# Patient Record
Sex: Male | Born: 1945
Health system: Southern US, Community
[De-identification: ages and names within clinical notes are randomized; demographics above are authoritative.]

## PROBLEM LIST (undated history)

## (undated) DIAGNOSIS — F32A Depression, unspecified: Secondary | ICD-10-CM

## (undated) DIAGNOSIS — R7303 Prediabetes: Secondary | ICD-10-CM

## (undated) DIAGNOSIS — F329 Major depressive disorder, single episode, unspecified: Secondary | ICD-10-CM

## (undated) DIAGNOSIS — M1990 Unspecified osteoarthritis, unspecified site: Secondary | ICD-10-CM

## (undated) DIAGNOSIS — K219 Gastro-esophageal reflux disease without esophagitis: Secondary | ICD-10-CM

## (undated) DIAGNOSIS — R51 Headache: Secondary | ICD-10-CM

## (undated) DIAGNOSIS — G4730 Sleep apnea, unspecified: Secondary | ICD-10-CM

## (undated) DIAGNOSIS — Z8719 Personal history of other diseases of the digestive system: Secondary | ICD-10-CM

## (undated) HISTORY — PX: BACK SURGERY: SHX140

## (undated) HISTORY — PX: KNEE ARTHROSCOPY: SUR90

## (undated) HISTORY — PX: VASECTOMY: SHX75

## (undated) HISTORY — PX: MASS EXCISION: SHX2000

---

## 1978-03-16 HISTORY — PX: VASECTOMY: SHX75

## 1986-03-16 HISTORY — PX: MASS EXCISION: SHX2000

## 1993-03-16 HISTORY — PX: BACK SURGERY: SHX140

## 1999-02-19 ENCOUNTER — Encounter: Admission: RE | Admit: 1999-02-19 | Discharge: 1999-02-19 | Payer: Self-pay | Admitting: *Deleted

## 2000-05-18 ENCOUNTER — Encounter (INDEPENDENT_AMBULATORY_CARE_PROVIDER_SITE_OTHER): Payer: Self-pay | Admitting: Specialist

## 2000-05-18 ENCOUNTER — Ambulatory Visit (HOSPITAL_COMMUNITY): Admission: RE | Admit: 2000-05-18 | Discharge: 2000-05-18 | Payer: Self-pay | Admitting: *Deleted

## 2002-08-21 ENCOUNTER — Ambulatory Visit (HOSPITAL_BASED_OUTPATIENT_CLINIC_OR_DEPARTMENT_OTHER): Admission: RE | Admit: 2002-08-21 | Discharge: 2002-08-21 | Payer: Self-pay | Admitting: Internal Medicine

## 2003-08-25 ENCOUNTER — Encounter: Admission: RE | Admit: 2003-08-25 | Discharge: 2003-08-25 | Payer: Self-pay | Admitting: Orthopedic Surgery

## 2008-01-12 ENCOUNTER — Encounter: Admission: RE | Admit: 2008-01-12 | Discharge: 2008-01-12 | Payer: Self-pay | Admitting: Orthopedic Surgery

## 2008-04-23 ENCOUNTER — Encounter: Admission: RE | Admit: 2008-04-23 | Discharge: 2008-04-23 | Payer: Self-pay | Admitting: Family Medicine

## 2011-08-31 ENCOUNTER — Other Ambulatory Visit: Payer: Self-pay | Admitting: Family Medicine

## 2011-08-31 DIAGNOSIS — F17200 Nicotine dependence, unspecified, uncomplicated: Secondary | ICD-10-CM

## 2011-08-31 DIAGNOSIS — Z8249 Family history of ischemic heart disease and other diseases of the circulatory system: Secondary | ICD-10-CM

## 2011-09-21 ENCOUNTER — Ambulatory Visit
Admission: RE | Admit: 2011-09-21 | Discharge: 2011-09-21 | Disposition: A | Discharge status: Home / Self Care | Payer: Medicare Other | Source: Ambulatory Visit | Attending: Family Medicine | Admitting: Family Medicine

## 2011-09-21 DIAGNOSIS — Z8249 Family history of ischemic heart disease and other diseases of the circulatory system: Secondary | ICD-10-CM

## 2011-09-21 DIAGNOSIS — F17200 Nicotine dependence, unspecified, uncomplicated: Secondary | ICD-10-CM | POA: Diagnosis not present

## 2011-10-08 DIAGNOSIS — M1710 Unilateral primary osteoarthritis, unspecified knee: Secondary | ICD-10-CM | POA: Diagnosis not present

## 2011-10-08 DIAGNOSIS — IMO0002 Reserved for concepts with insufficient information to code with codable children: Secondary | ICD-10-CM | POA: Diagnosis not present

## 2011-10-15 DIAGNOSIS — M1710 Unilateral primary osteoarthritis, unspecified knee: Secondary | ICD-10-CM | POA: Diagnosis not present

## 2011-10-15 DIAGNOSIS — IMO0002 Reserved for concepts with insufficient information to code with codable children: Secondary | ICD-10-CM | POA: Diagnosis not present

## 2011-10-22 DIAGNOSIS — M1710 Unilateral primary osteoarthritis, unspecified knee: Secondary | ICD-10-CM | POA: Diagnosis not present

## 2011-10-22 DIAGNOSIS — IMO0002 Reserved for concepts with insufficient information to code with codable children: Secondary | ICD-10-CM | POA: Diagnosis not present

## 2011-11-12 DIAGNOSIS — K648 Other hemorrhoids: Secondary | ICD-10-CM | POA: Diagnosis not present

## 2011-11-12 DIAGNOSIS — Z1211 Encounter for screening for malignant neoplasm of colon: Secondary | ICD-10-CM | POA: Diagnosis not present

## 2011-11-25 DIAGNOSIS — Z23 Encounter for immunization: Secondary | ICD-10-CM | POA: Diagnosis not present

## 2011-12-03 DIAGNOSIS — IMO0002 Reserved for concepts with insufficient information to code with codable children: Secondary | ICD-10-CM | POA: Diagnosis not present

## 2011-12-03 DIAGNOSIS — M1710 Unilateral primary osteoarthritis, unspecified knee: Secondary | ICD-10-CM | POA: Diagnosis not present

## 2011-12-16 DIAGNOSIS — D485 Neoplasm of uncertain behavior of skin: Secondary | ICD-10-CM | POA: Diagnosis not present

## 2011-12-16 DIAGNOSIS — L821 Other seborrheic keratosis: Secondary | ICD-10-CM | POA: Diagnosis not present

## 2011-12-16 DIAGNOSIS — L820 Inflamed seborrheic keratosis: Secondary | ICD-10-CM | POA: Diagnosis not present

## 2012-01-28 DIAGNOSIS — G43109 Migraine with aura, not intractable, without status migrainosus: Secondary | ICD-10-CM | POA: Diagnosis not present

## 2012-02-05 DIAGNOSIS — M1710 Unilateral primary osteoarthritis, unspecified knee: Secondary | ICD-10-CM | POA: Diagnosis not present

## 2012-02-05 DIAGNOSIS — IMO0002 Reserved for concepts with insufficient information to code with codable children: Secondary | ICD-10-CM | POA: Diagnosis not present

## 2012-03-18 DIAGNOSIS — M25569 Pain in unspecified knee: Secondary | ICD-10-CM | POA: Diagnosis not present

## 2012-03-18 DIAGNOSIS — I10 Essential (primary) hypertension: Secondary | ICD-10-CM | POA: Diagnosis not present

## 2012-03-18 DIAGNOSIS — G4730 Sleep apnea, unspecified: Secondary | ICD-10-CM | POA: Diagnosis not present

## 2012-03-18 DIAGNOSIS — Z1331 Encounter for screening for depression: Secondary | ICD-10-CM | POA: Diagnosis not present

## 2012-04-19 ENCOUNTER — Other Ambulatory Visit: Payer: Self-pay | Admitting: Orthopedic Surgery

## 2012-04-19 MED ORDER — DEXAMETHASONE SODIUM PHOSPHATE 10 MG/ML IJ SOLN: 10.0000 mg | Freq: Once | INTRAMUSCULAR | Status: DC

## 2012-04-19 MED ORDER — BUPIVACAINE LIPOSOME 1.3 % IJ SUSP: 20.0000 mL | Freq: Once | INTRAMUSCULAR | Status: DC

## 2012-04-19 NOTE — Progress Notes (Signed)
Preoperative surgical orders have been place into the Epic hospital system for Craig Rice on 04/19/2012, 7:48 AM  by Patrica Duel for surgery on 05/23/2012.  Preop Total Knee orders including Experal, IV Tylenol, and IV Decadron as long as there are no contraindications to the above medications. [name], PA-C

## 2012-05-10 ENCOUNTER — Encounter (HOSPITAL_COMMUNITY): Payer: Self-pay | Admitting: Pharmacy Technician

## 2012-05-16 ENCOUNTER — Encounter (HOSPITAL_COMMUNITY)
Admission: RE | Admit: 2012-05-16 | Discharge: 2012-05-16 | Disposition: A | Discharge status: Home / Self Care | Payer: Medicare Other | Source: Ambulatory Visit | Attending: Orthopedic Surgery | Admitting: Orthopedic Surgery

## 2012-05-16 ENCOUNTER — Encounter (HOSPITAL_COMMUNITY): Payer: Self-pay

## 2012-05-16 DIAGNOSIS — F3289 Other specified depressive episodes: Secondary | ICD-10-CM | POA: Diagnosis not present

## 2012-05-16 DIAGNOSIS — D62 Acute posthemorrhagic anemia: Secondary | ICD-10-CM | POA: Diagnosis not present

## 2012-05-16 DIAGNOSIS — E871 Hypo-osmolality and hyponatremia: Secondary | ICD-10-CM | POA: Diagnosis not present

## 2012-05-16 DIAGNOSIS — Z79899 Other long term (current) drug therapy: Secondary | ICD-10-CM | POA: Diagnosis not present

## 2012-05-16 DIAGNOSIS — M1710 Unilateral primary osteoarthritis, unspecified knee: Secondary | ICD-10-CM | POA: Diagnosis not present

## 2012-05-16 DIAGNOSIS — K219 Gastro-esophageal reflux disease without esophagitis: Secondary | ICD-10-CM | POA: Diagnosis present

## 2012-05-16 DIAGNOSIS — F329 Major depressive disorder, single episode, unspecified: Secondary | ICD-10-CM | POA: Diagnosis present

## 2012-05-16 DIAGNOSIS — IMO0002 Reserved for concepts with insufficient information to code with codable children: Secondary | ICD-10-CM | POA: Diagnosis not present

## 2012-05-16 HISTORY — DX: Depression, unspecified: F32.A

## 2012-05-16 HISTORY — DX: Personal history of other diseases of the digestive system: Z87.19

## 2012-05-16 HISTORY — DX: Gastro-esophageal reflux disease without esophagitis: K21.9

## 2012-05-16 HISTORY — DX: Headache: R51

## 2012-05-16 HISTORY — DX: Unspecified osteoarthritis, unspecified site: M19.90

## 2012-05-16 HISTORY — DX: Major depressive disorder, single episode, unspecified: F32.9

## 2012-05-16 LAB — COMPREHENSIVE METABOLIC PANEL
ALT: 28 U/L (ref 0–53)
AST: 28 U/L (ref 0–37)
Albumin: 4.1 g/dL (ref 3.5–5.2)
CO2: 26 mEq/L (ref 19–32)
Calcium: 9.5 mg/dL (ref 8.4–10.5)
Chloride: 102 mEq/L (ref 96–112)
GFR calc non Af Amer: 72 mL/min — ABNORMAL LOW (ref >90)
Sodium: 136 mEq/L (ref 135–145)
Total Bilirubin: 0.6 mg/dL (ref 0.3–1.2)

## 2012-05-16 LAB — ABO/RH: ABO/RH(D): O POS

## 2012-05-16 LAB — URINALYSIS, ROUTINE W REFLEX MICROSCOPIC
Glucose, UA: NEGATIVE mg/dL
Ketones, ur: NEGATIVE mg/dL
Leukocytes, UA: NEGATIVE
Nitrite: NEGATIVE
Protein, ur: NEGATIVE mg/dL

## 2012-05-16 LAB — CBC
Platelets: 186 10*3/uL (ref 150–400)
RBC: 4.6 MIL/uL (ref 4.22–5.81)
RDW: 12.6 % (ref 11.5–15.5)
WBC: 6.5 10*3/uL (ref 4.0–10.5)

## 2012-05-16 LAB — SURGICAL PCR SCREEN: Staphylococcus aureus: POSITIVE — AB

## 2012-05-16 NOTE — Progress Notes (Signed)
05/16/12 1012  OBSTRUCTIVE SLEEP APNEA  Have you ever been diagnosed with sleep apnea through a sleep study? No  If yes, do you have and use a CPAP or BPAP machine every night? 0  Do you snore loudly (loud enough to be heard through closed doors)?  1  Do you often feel tired, fatigued, or sleepy during the daytime? 0  Has anyone observed you stop breathing during your sleep? 1  Do you have, or are you being treated for high blood pressure? 0  BMI more than 35 kg/m2? 1  Age over 67 years old? 1  Neck circumference greater than 40 cm/18 inches? 0  Obstructive Sleep Apnea Score 4  Score 4 or greater  Results sent to PCP

## 2012-05-16 NOTE — Pre-Procedure Instructions (Addendum)
05-16-12 EKG 1'14 -report with chart. 05-16-12 1615 Pt. Notified with voice message of Positive PCR screen- Staph aureus. W. Hendrick,RN 05-17-12 1030 Pt. Confirmed receipt of call for Positive Staph aureus-will use Mupirocin as directed. W. Kennon Portela

## 2012-05-16 NOTE — Patient Instructions (Addendum)
20 Craig Rice  05/16/2012   Your procedure is scheduled on:   05-23-2012  Report to [facility] at    0730    AM .  Call this number if you have problems the morning of surgery: [phone]  Or Presurgical Testing [phone]([name])    Do not eat food:After Midnight.    Take these medicines the morning of surgery with A SIP OF WATER: Allopurinol. Metoprolol. Omeprazole. Paxil.   Do not wear jewelry, make-up or nail polish.  Do not wear lotions, powders, or perfumes. You may wear deodorant.  Do not shave 12 hours prior to first CHG shower(legs and under arms).(face and neck okay.)  Do not bring valuables to the hospital.  Contacts, dentures or bridgework,body piercing,  may not be worn into surgery.  Leave suitcase in the car. After surgery it may be brought to your room.  For patients admitted to the hospital, checkout time is 11:00 AM the day of discharge.   Patients discharged the day of surgery will not be allowed to drive home. Must have responsible person with you x 24 hours once discharged.  Name and phone number of your driver:  "[name]"-spouse [phone] cell Special Instructions: CHG(Chlorhedine 4%-"Hibiclens","Betasept","Aplicare") Shower Use Special Wash: see special instructions.(avoid face and genitals)   Please read over the following fact sheets that you were given: MRSA Information, Blood Transfusion fact sheet, Incentive Spirometry Instruction.    Failure to follow these instructions may result in Cancellation of your surgery.   Patient signature_______________________________________________________

## 2012-05-22 NOTE — H&P (Signed)
TOTAL KNEE ADMISSION H&P  Patient is being admitted for right total knee arthroplasty.  Subjective:  Chief Complaint:right knee pain.  HPI: Craig Rice, 68 y.o. male, has a history of pain and functional disability in the right knee due to arthritis and has failed non-surgical conservative treatments for greater than 12 weeks to includeNSAID's and/or analgesics, corticosteriod injections, viscosupplementation injections and activity modification.  Onset of symptoms was gradual, starting 5 years ago with gradually worsening course since that time. The patient noted prior procedures on the knee to include  arthroscopy and menisectomy on the right knee(s).  Patient currently rates pain in the right knee(s) at 7 out of 10 with activity. Patient has night pain, worsening of pain with activity and weight bearing, pain that interferes with activities of daily living, pain with passive range of motion and crepitus.  Patient has evidence of periarticular osteophytes and joint space narrowing by imaging studies. There is no active infection.  Past Medical History  Diagnosis Date  . Headache     tx. migraines-"Metoprolol"  . Depression   . GERD (gastroesophageal reflux disease)   . H/O hiatal hernia   . Arthritis     osteoarthritis -knee    Past Surgical History  Procedure Laterality Date  . Back surgery      lumbar laminectomy  . Knee arthroscopy      bil. knee scopes  . Vasectomy    . Mass excision      chest"benign"     Current outpatient prescriptions: allopurinol (ZYLOPRIM) 100 MG tablet, Take 200 mg by mouth daily., Disp: , Rfl: ;   metoprolol (LOPRESSOR) 50 MG tablet, Take 50 mg by mouth daily before breakfast., Disp: , Rfl: ;  naproxen sodium (ANAPROX) 220 MG tablet, Take 220 mg by mouth 2 (two) times daily with a meal., Disp: , Rfl: ;   omeprazole (PRILOSEC OTC) 20 MG tablet, Take 20-40 mg by mouth daily., Disp: , Rfl:  PARoxetine (PAXIL) 20 MG tablet, Take 20 mg by mouth daily  before breakfast., Disp: , Rfl:    No Known Allergies  History  Substance Use Topics  . Smoking status: Former Smoker    Types: Cigarettes    Quit date: 05/16/1980  . Smokeless tobacco: Not on file  . Alcohol Use: 3.0 oz/week    5 Shots of liquor per week     Comment: x 5 drinks per week    Family History Father age 57 living; history of aneurysm and total knee arthroplasty Mother age 15 living; history of bilateral total knee and total hip arthroplasties Brother age 19 living; history total knee arthroplasty Sister age 69 living: history of total hip arthroplasty   Review of Systems  Constitutional: Positive for malaise/fatigue. Negative for fever, chills, weight loss and diaphoresis.  HENT: Positive for tinnitus. Negative for hearing loss, ear pain, nosebleeds, congestion, sore throat, neck pain and ear discharge.   Eyes: Negative.   Respiratory: Negative.  Negative for stridor.   Cardiovascular: Positive for orthopnea. Negative for chest pain, palpitations, claudication, leg swelling and PND.  Gastrointestinal: Positive for heartburn. Negative for nausea, vomiting, abdominal pain, diarrhea, constipation, blood in stool and melena.  Genitourinary: Negative.   Musculoskeletal: Positive for back pain and joint pain. Negative for myalgias and falls.       Right knee pain  Skin: Negative.   Neurological: Positive for tremors. Negative for dizziness, tingling, sensory change, speech change, focal weakness, seizures, loss of consciousness, weakness and headaches.  Endo/Heme/Allergies: Negative.  Psychiatric/Behavioral: Negative.     Objective:  Physical Exam  Constitutional: He is oriented to person, place, and time. He appears well-developed and well-nourished. No distress.  HENT:  Head: Normocephalic and atraumatic.  Right Ear: External ear normal.  Left Ear: External ear normal.  Nose: Nose normal.  Mouth/Throat: Oropharynx is clear and moist.  Eyes: Conjunctivae and  EOM are normal.  Neck: Normal range of motion. Neck supple. No tracheal deviation present. No thyromegaly present.  Cardiovascular: Normal rate, regular rhythm, normal heart sounds and intact distal pulses.   No murmur heard. Respiratory: Effort normal and breath sounds normal. No respiratory distress. He has no wheezes. He exhibits no tenderness.  GI: Soft. Bowel sounds are normal. He exhibits no distension and no mass. There is no tenderness.  Musculoskeletal:       Right hip: Normal.       Left hip: Normal.       Right knee: He exhibits decreased range of motion and deformity. He exhibits no effusion and no erythema. Tenderness found. Medial joint line and lateral joint line tenderness noted.       Left knee: Normal.       Right lower leg: He exhibits no tenderness and no swelling.       Left lower leg: He exhibits no tenderness and no swelling.  Right knee ROM 5 to 120 degrees. Moderate patellofemoral crepitus.   Lymphadenopathy:    He has no cervical adenopathy.  Neurological: He is alert and oriented to person, place, and time. He has normal strength and normal reflexes. No sensory deficit.  Skin: No rash noted. He is not diaphoretic. No erythema.  Psychiatric: He has a normal mood and affect. His behavior is normal.   Vitals Weight: 270 lb Height: 72 in Body Surface Area: 2.49 m Body Mass Index: 36.62 kg/m Pulse: 60 (Regular) BP: 122/74 (Sitting, Left Arm, Standard)    Imaging Review Plain radiographs demonstrate severe degenerative joint disease of the right knee(s). The overall alignment ismild varus. The bone quality appears to be good for age and reported activity level.  Assessment/Plan:  End stage arthritis, right knee   The patient history, physical examination, clinical judgment of the provider and imaging studies are consistent with end stage degenerative joint disease of the right knee(s) and total knee arthroplasty is deemed medically necessary. The  treatment options including medical management, injection therapy arthroscopy and arthroplasty were discussed at length. The risks and benefits of total knee arthroplasty were presented and reviewed. The risks due to aseptic loosening, infection, stiffness, patella tracking problems, thromboembolic complications and other imponderables were discussed. The patient acknowledged the explanation, agreed to proceed with the plan and consent was signed. Patient is being admitted for inpatient treatment for surgery, pain control, PT, OT, prophylactic antibiotics, VTE prophylaxis, progressive ambulation and ADL's and discharge planning. The patient is planning to be discharged home with home health services     [place], [place]

## 2012-05-23 ENCOUNTER — Inpatient Hospital Stay (HOSPITAL_COMMUNITY): Payer: Medicare Other | Admitting: Certified Registered"

## 2012-05-23 ENCOUNTER — Encounter (HOSPITAL_COMMUNITY): Payer: Self-pay | Admitting: Certified Registered"

## 2012-05-23 ENCOUNTER — Inpatient Hospital Stay (HOSPITAL_COMMUNITY)
Admission: RE | Admit: 2012-05-23 | Discharge: 2012-05-25 | DRG: 470 | Disposition: A | Discharge status: Home Health | Payer: Medicare Other | Source: Ambulatory Visit | Attending: Orthopedic Surgery | Admitting: Orthopedic Surgery

## 2012-05-23 ENCOUNTER — Encounter (HOSPITAL_COMMUNITY)
Admission: RE | Disposition: A | Discharge status: Home Health | Payer: Self-pay | Source: Ambulatory Visit | Attending: Orthopedic Surgery

## 2012-05-23 ENCOUNTER — Encounter (HOSPITAL_COMMUNITY): Payer: Self-pay | Admitting: *Deleted

## 2012-05-23 DIAGNOSIS — Z79899 Other long term (current) drug therapy: Secondary | ICD-10-CM

## 2012-05-23 DIAGNOSIS — F329 Major depressive disorder, single episode, unspecified: Secondary | ICD-10-CM | POA: Diagnosis present

## 2012-05-23 DIAGNOSIS — M179 Osteoarthritis of knee, unspecified: Secondary | ICD-10-CM | POA: Diagnosis present

## 2012-05-23 DIAGNOSIS — D62 Acute posthemorrhagic anemia: Secondary | ICD-10-CM

## 2012-05-23 DIAGNOSIS — M1710 Unilateral primary osteoarthritis, unspecified knee: Principal | ICD-10-CM | POA: Diagnosis present

## 2012-05-23 DIAGNOSIS — E871 Hypo-osmolality and hyponatremia: Secondary | ICD-10-CM

## 2012-05-23 DIAGNOSIS — F3289 Other specified depressive episodes: Secondary | ICD-10-CM | POA: Diagnosis present

## 2012-05-23 DIAGNOSIS — K219 Gastro-esophageal reflux disease without esophagitis: Secondary | ICD-10-CM | POA: Diagnosis present

## 2012-05-23 DIAGNOSIS — Z96651 Presence of right artificial knee joint: Secondary | ICD-10-CM

## 2012-05-23 HISTORY — PX: TOTAL KNEE ARTHROPLASTY: SHX125

## 2012-05-23 LAB — TYPE AND SCREEN: ABO/RH(D): O POS

## 2012-05-23 SURGERY — ARTHROPLASTY, KNEE, TOTAL
Anesthesia: Spinal | Site: Knee | Laterality: Right | Wound class: Clean

## 2012-05-23 MED ORDER — ONDANSETRON HCL 4 MG PO TABS: 4.0000 mg | ORAL_TABLET | Freq: Four times a day (QID) | ORAL | Status: DC | PRN

## 2012-05-23 MED ORDER — PAROXETINE HCL 20 MG PO TABS
20.0000 mg | ORAL_TABLET | Freq: Every day | ORAL | Status: DC
  Administered 2012-05-24 – 2012-05-25 (×2): 20 mg via ORAL
  Filled 2012-05-23 (×3): qty 1

## 2012-05-23 MED ORDER — PHENOL 1.4 % MT LIQD
1.0000 | OROMUCOSAL | Status: DC | PRN
  Filled 2012-05-23: qty 177

## 2012-05-23 MED ORDER — MIDAZOLAM HCL 5 MG/5ML IJ SOLN
INTRAMUSCULAR | Status: DC | PRN
  Administered 2012-05-23: 2 mg via INTRAVENOUS

## 2012-05-23 MED ORDER — METOCLOPRAMIDE HCL 5 MG/ML IJ SOLN: 5.0000 mg | Freq: Three times a day (TID) | INTRAMUSCULAR | Status: DC | PRN

## 2012-05-23 MED ORDER — OXYCODONE HCL 5 MG PO TABS
5.0000 mg | ORAL_TABLET | ORAL | Status: DC | PRN
  Administered 2012-05-23 – 2012-05-25 (×11): 10 mg via ORAL
  Filled 2012-05-23 (×11): qty 2

## 2012-05-23 MED ORDER — POLYETHYLENE GLYCOL 3350 17 G PO PACK: 17.0000 g | PACK | Freq: Every day | ORAL | Status: DC | PRN

## 2012-05-23 MED ORDER — LACTATED RINGERS IV SOLN
INTRAVENOUS | Status: DC
  Administered 2012-05-23: 1000 mL via INTRAVENOUS

## 2012-05-23 MED ORDER — SODIUM CHLORIDE 0.9 % IV SOLN: INTRAVENOUS | Status: DC

## 2012-05-23 MED ORDER — ACETAMINOPHEN 650 MG RE SUPP: 650.0000 mg | Freq: Four times a day (QID) | RECTAL | Status: DC | PRN

## 2012-05-23 MED ORDER — LIDOCAINE HCL (CARDIAC) 20 MG/ML IV SOLN
INTRAVENOUS | Status: DC | PRN
  Administered 2012-05-23: 20 mg via INTRAVENOUS

## 2012-05-23 MED ORDER — BUPIVACAINE LIPOSOME 1.3 % IJ SUSP
20.0000 mL | Freq: Once | INTRAMUSCULAR | Status: DC
  Filled 2012-05-23: qty 20

## 2012-05-23 MED ORDER — PROPOFOL INFUSION 10 MG/ML OPTIME
INTRAVENOUS | Status: DC | PRN
  Administered 2012-05-23: 100 ug/kg/min via INTRAVENOUS

## 2012-05-23 MED ORDER — FENTANYL CITRATE 0.05 MG/ML IJ SOLN
INTRAMUSCULAR | Status: DC | PRN
  Administered 2012-05-23 (×2): 50 ug via INTRAVENOUS

## 2012-05-23 MED ORDER — TRAMADOL HCL 50 MG PO TABS: 50.0000 mg | ORAL_TABLET | Freq: Four times a day (QID) | ORAL | Status: DC | PRN

## 2012-05-23 MED ORDER — BISACODYL 10 MG RE SUPP: 10.0000 mg | Freq: Every day | RECTAL | Status: DC | PRN

## 2012-05-23 MED ORDER — ACETAMINOPHEN 10 MG/ML IV SOLN
1000.0000 mg | Freq: Once | INTRAVENOUS | Status: AC
  Administered 2012-05-23: 1000 mg via INTRAVENOUS

## 2012-05-23 MED ORDER — DEXAMETHASONE SODIUM PHOSPHATE 10 MG/ML IJ SOLN: 10.0000 mg | Freq: Once | INTRAMUSCULAR | Status: AC

## 2012-05-23 MED ORDER — OMEPRAZOLE MAGNESIUM 20 MG PO TBEC: 20.0000 mg | DELAYED_RELEASE_TABLET | Freq: Every day | ORAL | Status: DC

## 2012-05-23 MED ORDER — METHOCARBAMOL 100 MG/ML IJ SOLN: 500.0000 mg | Freq: Four times a day (QID) | INTRAVENOUS | Status: DC | PRN

## 2012-05-23 MED ORDER — SODIUM CHLORIDE 0.9 % IR SOLN
Status: DC | PRN
  Administered 2012-05-23: 1000 mL

## 2012-05-23 MED ORDER — MENTHOL 3 MG MT LOZG
1.0000 | LOZENGE | OROMUCOSAL | Status: DC | PRN
  Filled 2012-05-23: qty 9

## 2012-05-23 MED ORDER — METHOCARBAMOL 500 MG PO TABS
500.0000 mg | ORAL_TABLET | Freq: Four times a day (QID) | ORAL | Status: DC | PRN
  Administered 2012-05-23 – 2012-05-25 (×4): 500 mg via ORAL
  Filled 2012-05-23 (×4): qty 1

## 2012-05-23 MED ORDER — RIVAROXABAN 10 MG PO TABS
10.0000 mg | ORAL_TABLET | Freq: Every day | ORAL | Status: DC
  Administered 2012-05-24 – 2012-05-25 (×2): 10 mg via ORAL
  Filled 2012-05-23 (×3): qty 1

## 2012-05-23 MED ORDER — SODIUM CHLORIDE 0.9 % IV SOLN
INTRAVENOUS | Status: DC
  Administered 2012-05-23 (×2): via INTRAVENOUS

## 2012-05-23 MED ORDER — EPHEDRINE SULFATE 50 MG/ML IJ SOLN
INTRAMUSCULAR | Status: DC | PRN
  Administered 2012-05-23 (×4): 5 mg via INTRAVENOUS

## 2012-05-23 MED ORDER — CEFAZOLIN SODIUM-DEXTROSE 2-3 GM-% IV SOLR
2.0000 g | Freq: Four times a day (QID) | INTRAVENOUS | Status: AC
  Administered 2012-05-23 (×2): 2 g via INTRAVENOUS
  Filled 2012-05-23 (×2): qty 50

## 2012-05-23 MED ORDER — ONDANSETRON HCL 4 MG/2ML IJ SOLN: 4.0000 mg | Freq: Four times a day (QID) | INTRAMUSCULAR | Status: DC | PRN

## 2012-05-23 MED ORDER — DEXTROSE 5 % IV SOLN
3.0000 g | INTRAVENOUS | Status: AC
  Administered 2012-05-23: 3 g via INTRAVENOUS
  Filled 2012-05-23: qty 3000

## 2012-05-23 MED ORDER — ONDANSETRON HCL 4 MG/2ML IJ SOLN
INTRAMUSCULAR | Status: DC | PRN
  Administered 2012-05-23: 4 mg via INTRAVENOUS

## 2012-05-23 MED ORDER — SODIUM CHLORIDE 0.9 % IJ SOLN
INTRAMUSCULAR | Status: DC | PRN
  Administered 2012-05-23: 12:00:00

## 2012-05-23 MED ORDER — METOCLOPRAMIDE HCL 10 MG PO TABS
5.0000 mg | ORAL_TABLET | Freq: Three times a day (TID) | ORAL | Status: DC | PRN
  Administered 2012-05-24: 10 mg via ORAL
  Filled 2012-05-23: qty 1

## 2012-05-23 MED ORDER — MORPHINE SULFATE 2 MG/ML IJ SOLN
1.0000 mg | INTRAMUSCULAR | Status: DC | PRN
  Administered 2012-05-23: 1 mg via INTRAVENOUS
  Filled 2012-05-23: qty 1

## 2012-05-23 MED ORDER — CHLORHEXIDINE GLUCONATE 4 % EX LIQD
60.0000 mL | Freq: Once | CUTANEOUS | Status: DC
  Filled 2012-05-23: qty 60

## 2012-05-23 MED ORDER — DIPHENHYDRAMINE HCL 12.5 MG/5ML PO ELIX
12.5000 mg | ORAL_SOLUTION | ORAL | Status: DC | PRN
  Administered 2012-05-23: 12.5 mg via ORAL
  Filled 2012-05-23: qty 5

## 2012-05-23 MED ORDER — LACTATED RINGERS IV SOLN: INTRAVENOUS | Status: DC

## 2012-05-23 MED ORDER — DOCUSATE SODIUM 100 MG PO CAPS
100.0000 mg | ORAL_CAPSULE | Freq: Two times a day (BID) | ORAL | Status: DC
  Administered 2012-05-23 – 2012-05-25 (×5): 100 mg via ORAL

## 2012-05-23 MED ORDER — HYDROMORPHONE HCL PF 1 MG/ML IJ SOLN: 0.2500 mg | INTRAMUSCULAR | Status: DC | PRN

## 2012-05-23 MED ORDER — BUPIVACAINE IN DEXTROSE 0.75-8.25 % IT SOLN
INTRATHECAL | Status: DC | PRN
  Administered 2012-05-23: 2 mL via INTRATHECAL

## 2012-05-23 MED ORDER — ACETAMINOPHEN 10 MG/ML IV SOLN
1000.0000 mg | Freq: Four times a day (QID) | INTRAVENOUS | Status: AC
  Administered 2012-05-23 – 2012-05-24 (×4): 1000 mg via INTRAVENOUS
  Filled 2012-05-23 (×7): qty 100

## 2012-05-23 MED ORDER — ALLOPURINOL 100 MG PO TABS
200.0000 mg | ORAL_TABLET | Freq: Every day | ORAL | Status: DC
  Administered 2012-05-24 – 2012-05-25 (×2): 200 mg via ORAL
  Filled 2012-05-23 (×3): qty 2

## 2012-05-23 MED ORDER — METOPROLOL TARTRATE 50 MG PO TABS
50.0000 mg | ORAL_TABLET | Freq: Every day | ORAL | Status: DC
  Administered 2012-05-24 – 2012-05-25 (×2): 50 mg via ORAL
  Filled 2012-05-23 (×3): qty 1

## 2012-05-23 MED ORDER — 0.9 % SODIUM CHLORIDE (POUR BTL) OPTIME
TOPICAL | Status: DC | PRN
  Administered 2012-05-23: 1000 mL

## 2012-05-23 MED ORDER — DEXAMETHASONE 6 MG PO TABS
10.0000 mg | ORAL_TABLET | Freq: Once | ORAL | Status: AC
  Administered 2012-05-24: 10 mg via ORAL
  Filled 2012-05-23: qty 1

## 2012-05-23 MED ORDER — FLEET ENEMA 7-19 GM/118ML RE ENEM: 1.0000 | ENEMA | Freq: Once | RECTAL | Status: AC | PRN

## 2012-05-23 MED ORDER — ACETAMINOPHEN 325 MG PO TABS: 650.0000 mg | ORAL_TABLET | Freq: Four times a day (QID) | ORAL | Status: DC | PRN

## 2012-05-23 MED ORDER — HYDROMORPHONE HCL PF 1 MG/ML IJ SOLN
0.5000 mg | INTRAMUSCULAR | Status: DC | PRN
  Administered 2012-05-23: 0.5 mg via INTRAVENOUS
  Administered 2012-05-24 (×2): 1 mg via INTRAVENOUS
  Filled 2012-05-23 (×3): qty 1

## 2012-05-23 SURGICAL SUPPLY — 60 items
BAG SPEC THK2 15X12 ZIP CLS (MISCELLANEOUS) ×1
BAG ZIPLOCK 12X15 (MISCELLANEOUS) ×2 IMPLANT
BANDAGE ELASTIC 6 VELCRO ST LF (GAUZE/BANDAGES/DRESSINGS) ×1 IMPLANT
BANDAGE ESMARK 6X9 LF (GAUZE/BANDAGES/DRESSINGS) ×1 IMPLANT
BLADE SAG 18X100X1.27 (BLADE) ×2 IMPLANT
BLADE SAW SGTL 11.0X1.19X90.0M (BLADE) ×2 IMPLANT
BNDG CMPR 9X6 STRL LF SNTH (GAUZE/BANDAGES/DRESSINGS) ×1
BNDG ESMARK 6X9 LF (GAUZE/BANDAGES/DRESSINGS) ×2
BOWL SMART MIX CTS (DISPOSABLE) ×2 IMPLANT
CEMENT HV SMART SET (Cement) ×4 IMPLANT
CLOTH BEACON ORANGE TIMEOUT ST (SAFETY) ×2 IMPLANT
CUFF TOURN SGL QUICK 34 (TOURNIQUET CUFF) ×2
CUFF TRNQT CYL 34X4X40X1 (TOURNIQUET CUFF) ×1 IMPLANT
DRAPE EXTREMITY T 121X128X90 (DRAPE) ×2 IMPLANT
DRAPE POUCH INSTRU U-SHP 10X18 (DRAPES) ×2 IMPLANT
DRAPE U-SHAPE 47X51 STRL (DRAPES) ×2 IMPLANT
DRSG ADAPTIC 3X8 NADH LF (GAUZE/BANDAGES/DRESSINGS) ×1 IMPLANT
DRSG PAD ABDOMINAL 8X10 ST (GAUZE/BANDAGES/DRESSINGS) ×1 IMPLANT
DURAPREP 26ML APPLICATOR (WOUND CARE) ×3 IMPLANT
ELECT REM PT RETURN 9FT ADLT (ELECTROSURGICAL) ×2
ELECTRODE REM PT RTRN 9FT ADLT (ELECTROSURGICAL) ×1 IMPLANT
EVACUATOR 1/8 PVC DRAIN (DRAIN) ×2 IMPLANT
FACESHIELD LNG OPTICON STERILE (SAFETY) ×10 IMPLANT
GLOVE BIO SURGEON STRL SZ 6.5 (GLOVE) ×1 IMPLANT
GLOVE BIO SURGEON STRL SZ7.5 (GLOVE) ×3 IMPLANT
GLOVE BIO SURGEON STRL SZ8 (GLOVE) ×2 IMPLANT
GLOVE BIOGEL PI IND STRL 8 (GLOVE) ×2 IMPLANT
GLOVE BIOGEL PI INDICATOR 8 (GLOVE) ×2
GLOVE INDICATOR 6.5 STRL GRN (GLOVE) ×1 IMPLANT
GLOVE SURG SS PI 6.5 STRL IVOR (GLOVE) ×4 IMPLANT
GOWN STRL NON-REIN LRG LVL3 (GOWN DISPOSABLE) ×4 IMPLANT
GOWN STRL REIN XL XLG (GOWN DISPOSABLE) ×2 IMPLANT
HANDPIECE INTERPULSE COAX TIP (DISPOSABLE) ×2
IMMOBILIZER KNEE 20 (SOFTGOODS)
IMMOBILIZER KNEE 20 THIGH 36 (SOFTGOODS) ×1 IMPLANT
IMMOBILIZER KNEE 22 UNIV (SOFTGOODS) ×1 IMPLANT
KIT BASIN OR (CUSTOM PROCEDURE TRAY) ×2 IMPLANT
MANIFOLD NEPTUNE II (INSTRUMENTS) ×2 IMPLANT
NDL SAFETY ECLIPSE 18X1.5 (NEEDLE) ×1 IMPLANT
NEEDLE HYPO 18GX1.5 SHARP (NEEDLE) ×2
NS IRRIG 1000ML POUR BTL (IV SOLUTION) ×2 IMPLANT
PACK TOTAL JOINT (CUSTOM PROCEDURE TRAY) ×2 IMPLANT
PAD ABD 7.5X8 STRL (GAUZE/BANDAGES/DRESSINGS) ×1 IMPLANT
PADDING CAST ABS 6INX4YD NS (CAST SUPPLIES) ×1
PADDING CAST ABS COTTON 6X4 NS (CAST SUPPLIES) IMPLANT
PADDING CAST COTTON 6X4 STRL (CAST SUPPLIES) ×6 IMPLANT
POSITIONER SURGICAL ARM (MISCELLANEOUS) ×2 IMPLANT
SET HNDPC FAN SPRY TIP SCT (DISPOSABLE) ×1 IMPLANT
SPONGE GAUZE 4X4 12PLY (GAUZE/BANDAGES/DRESSINGS) ×1 IMPLANT
STRIP CLOSURE SKIN 1/2X4 (GAUZE/BANDAGES/DRESSINGS) ×3 IMPLANT
SUCTION FRAZIER 12FR DISP (SUCTIONS) ×2 IMPLANT
SUT MNCRL AB 4-0 PS2 18 (SUTURE) ×2 IMPLANT
SUT VIC AB 2-0 CT1 27 (SUTURE) ×6
SUT VIC AB 2-0 CT1 TAPERPNT 27 (SUTURE) ×3 IMPLANT
SUT VLOC 180 0 24IN GS25 (SUTURE) ×2 IMPLANT
SYR 50ML LL SCALE MARK (SYRINGE) ×2 IMPLANT
TOWEL OR 17X26 10 PK STRL BLUE (TOWEL DISPOSABLE) ×4 IMPLANT
TRAY FOLEY CATH 14FRSI W/METER (CATHETERS) ×2 IMPLANT
WATER STERILE IRR 1500ML POUR (IV SOLUTION) ×3 IMPLANT
WRAP KNEE MAXI GEL POST OP (GAUZE/BANDAGES/DRESSINGS) ×4 IMPLANT

## 2012-05-23 NOTE — Interval H&P Note (Signed)
History and Physical Interval Note:  05/23/2012 9:23 AM  Craig Rice  has presented today for surgery, with the diagnosis of Osteoarthritis of the Right Knee  The various methods of treatment have been discussed with the patient and family. After consideration of risks, benefits and other options for treatment, the patient has consented to  Procedure(s): TOTAL KNEE ARTHROPLASTY (Right) as a surgical intervention .  The patient's history has been reviewed, patient examined, no change in status, stable for surgery.  I have reviewed the patient's chart and labs.  Questions were answered to the patient's satisfaction.     Loanne Drilling

## 2012-05-23 NOTE — Care Management (Signed)
[facility] is following this pt. Craig Rice [phone] °

## 2012-05-23 NOTE — Anesthesia Procedure Notes (Addendum)
Spinal  Patient location during procedure: OR Start time: 05/23/2012 10:25 AM End time: 05/23/2012 10:30 AM Staffing Anesthesiologist: Ronelle Nigh L CRNA/Resident: Early Osmond E Performed by: resident/CRNA  Preanesthetic Checklist Completed: patient identified, site marked, surgical consent, pre-op evaluation, timeout performed, IV checked, risks and benefits discussed and monitors and equipment checked Spinal Block Patient position: sitting Prep: Betadine Patient monitoring: continuous pulse ox and blood pressure Approach: midline Location: L3-4 Injection technique: single-shot Needle Needle type: Sprotte  Needle gauge: 25 G Needle length: 9 cm Assessment Sensory level: T6 Additional Notes Patient tol well.

## 2012-05-23 NOTE — Anesthesia Postprocedure Evaluation (Signed)
  Anesthesia Post-op Note  Patient: Craig Rice  Procedure(s) Performed: Procedure(s) (LRB): TOTAL KNEE ARTHROPLASTY (Right)  Patient Location: PACU  Anesthesia Type: Spinal  Level of Consciousness: awake and alert   Airway and Oxygen Therapy: Patient Spontanous Breathing  Post-op Pain: mild  Post-op Assessment: Post-op Vital signs reviewed, Patient's Cardiovascular Status Stable, Respiratory Function Stable, Patent Airway and No signs of Nausea or vomiting  Last Vitals:  Filed Vitals:   05/23/12 1230  BP: 128/73  Pulse: 60  Temp:   Resp: 18    Post-op Vital Signs: stable   Complications: No apparent anesthesia complications

## 2012-05-23 NOTE — Transfer of Care (Signed)
Immediate Anesthesia Transfer of Care Note  Patient: Craig Rice  Procedure(s) Performed: Procedure(s): TOTAL KNEE ARTHROPLASTY (Right)  Patient Location: PACU  Anesthesia Type:Spinal  Level of Consciousness: awake, alert , oriented and patient cooperative  Airway & Oxygen Therapy: Patient Spontanous Breathing and Patient connected to face mask  Post-op Assessment: Report given to PACU RN and Post -op Vital signs reviewed and stable  Post vital signs: Reviewed and stable  Complications: No apparent anesthesia complications

## 2012-05-23 NOTE — Op Note (Signed)
Pre-operative diagnosis- Osteoarthritis  Right knee(s)  Post-operative diagnosis- Osteoarthritis Right knee(s)  Procedure-  Right  Total Knee Arthroplasty  Surgeon- Gus Rankin. Aluisio, MD  Assistant- [name], PA-C   Anesthesia-  Spinal EBL-* No blood loss amount entered *  Drains Hemovac  Tourniquet time-  Total Tourniquet Time Documented: Thigh (Right) - 45 minutes Total: Thigh (Right) - 45 minutes    Complications- None  Condition-PACU - hemodynamically stable.   Brief Clinical Note  Craig Rice is a 67 y.o. year old male with end stage OA of his right knee with progressively worsening pain and dysfunction. He has constant pain, with activity and at rest and significant functional deficits with difficulties even with ADLs. He has had extensive non-op management including analgesics, injections of cortisone and viscosupplements, and home exercise program, but remains in significant pain with significant dysfunction. Radiographs show bone on bone arthritis medial and patellofemoral. He presents now for right Total Knee Arthroplasty.    Procedure in detail---   The patient is brought into the operating room and positioned supine on the operating table. After successful administration of  Spinal,   a tourniquet is placed high on the  Right thigh(s) and the lower extremity is prepped and draped in the usual sterile fashion. Time out is performed by the operating team and then the  Right lower extremity is wrapped in Esmarch, knee flexed and the tourniquet inflated to 300 mmHg.       A midline incision is made with a ten blade through the subcutaneous tissue to the level of the extensor mechanism. A fresh blade is used to make a medial parapatellar arthrotomy. Soft tissue over the proximal medial tibia is subperiosteally elevated to the joint line with a knife and into the semimembranosus bursa with a Cobb elevator. Soft tissue over the proximal lateral tibia is elevated with attention  being paid to avoiding the patellar tendon on the tibial tubercle. The patella is everted, knee flexed 90 degrees and the ACL and PCL are removed. Findings are bone on bone medial and patellofemoral with large global osteophytes.        The drill is used to create a starting hole in the distal femur and the canal is thoroughly irrigated with sterile saline to remove the fatty contents. The 5 degree Right  valgus alignment guide is placed into the femoral canal and the distal femoral cutting block is pinned to remove 11 mm off the distal femur. Resection is made with an oscillating saw.      The tibia is subluxed forward and the menisci are removed. The extramedullary alignment guide is placed referencing proximally at the medial aspect of the tibial tubercle and distally along the second metatarsal axis and tibial crest. The block is pinned to remove 2mm off the more deficient medial  side. Resection is made with an oscillating saw. Size 5is the most appropriate size for the tibia and the proximal tibia is prepared with the modular drill and keel punch for that size.      The femoral sizing guide is placed and size 6 is most appropriate. Rotation is marked off the epicondylar axis and confirmed by creating a rectangular flexion gap at 90 degrees. The size 6 cutting block is pinned in this rotation and the anterior, posterior and chamfer cuts are made with the oscillating saw. The intercondylar block is then placed and that cut is made.      Trial size 5 tibial component, trial size 6  posterior stabilized femur and a 10  mm posterior stabilized rotating platform insert trial is placed. Full extension is achieved with excellent varus/valgus and anterior/posterior balance throughout full range of motion. The patella is everted and thickness measured to be 27  mm. Free hand resection is taken to 15 mm, a 41 template is placed, lug holes are drilled, trial patella is placed, and it tracks normally. Osteophytes are  removed off the posterior femur with the trial in place. All trials are removed and the cut bone surfaces prepared with pulsatile lavage. Cement is mixed and once ready for implantation, the size 5 tibial implant, size  6 posterior stabilized femoral component, and the size 41 patella are cemented in place and the patella is held with the clamp. The trial insert is placed and the knee held in full extension. The Exparel (20 ml mixed with 50 ml saline) is injected into the extensor mechanism, posterior capsule, medial and lateral gutters and subcutaneous tissues.  All extruded cement is removed and once the cement is hard the permanent 10 mm posterior stabilized rotating platform insert is placed into the tibial tray.      The wound is copiously irrigated with saline solution and the extensor mechanism closed over a hemovac drain with #1 PDS suture. The tourniquet is released for a total tourniquet time of 45  minutes. Flexion against gravity is 140 degrees and the patella tracks normally. Subcutaneous tissue is closed with 2.0 vicryl and subcuticular with running 4.0 Monocryl. The incision is cleaned and dried and steri-strips and a bulky sterile dressing are applied. The limb is placed into a knee immobilizer and the patient is awakened and transported to recovery in stable condition.      Please note that a surgical assistant was a medical necessity for this procedure in order to perform it in a safe and expeditious manner. Surgical assistant was necessary to retract the ligaments and vital neurovascular structures to prevent injury to them and also necessary for proper positioning of the limb to allow for anatomic placement of the prosthesis.   Gus Rankin Aluisio, MD    05/23/2012, 11:40 AM

## 2012-05-23 NOTE — Anesthesia Preprocedure Evaluation (Signed)
Anesthesia Evaluation  Patient identified by MRN, date of birth, ID band Patient awake    Reviewed: Allergy & Precautions, H&P , NPO status , Patient's Chart, lab work & pertinent test results, reviewed documented beta blocker date and time   Airway Mallampati: II TM Distance: >3 FB Neck ROM: full    Dental no notable dental hx.    Pulmonary neg pulmonary ROS,  breath sounds clear to auscultation  Pulmonary exam normal       Cardiovascular Exercise Tolerance: Good negative cardio ROS  Rhythm:regular Rate:Normal     Neuro/Psych negative neurological ROS  negative psych ROS   GI/Hepatic negative GI ROS, Neg liver ROS, GERD-  Medicated and Controlled,  Endo/Other  negative endocrine ROS  Renal/GU negative Renal ROS  negative genitourinary   Musculoskeletal   Abdominal   Peds  Hematology negative hematology ROS (+)   Anesthesia Other Findings   Reproductive/Obstetrics negative OB ROS                           Anesthesia Physical Anesthesia Plan  ASA: II  Anesthesia Plan: Spinal   Post-op Pain Management:    Induction:   Airway Management Planned: Simple Face Mask  Additional Equipment:   Intra-op Plan:   Post-operative Plan:   Informed Consent: I have reviewed the patients History and Physical, chart, labs and discussed the procedure including the risks, benefits and alternatives for the proposed anesthesia with the patient or authorized representative who has indicated his/her understanding and acceptance.   Dental Advisory Given  Plan Discussed with: CRNA and Surgeon  Anesthesia Plan Comments:         Anesthesia Quick Evaluation

## 2012-05-23 NOTE — Progress Notes (Signed)
Utilization review completed.  

## 2012-05-24 ENCOUNTER — Encounter (HOSPITAL_COMMUNITY): Payer: Self-pay | Admitting: Orthopedic Surgery

## 2012-05-24 DIAGNOSIS — D62 Acute posthemorrhagic anemia: Secondary | ICD-10-CM

## 2012-05-24 LAB — CBC
HCT: 33 % — ABNORMAL LOW (ref 39.0–52.0)
Hemoglobin: 11.5 g/dL — ABNORMAL LOW (ref 13.0–17.0)
MCHC: 34.8 g/dL (ref 30.0–36.0)
RBC: 3.76 MIL/uL — ABNORMAL LOW (ref 4.22–5.81)
WBC: 8 10*3/uL (ref 4.0–10.5)

## 2012-05-24 LAB — BASIC METABOLIC PANEL
BUN: 14 mg/dL (ref 6–23)
Chloride: 101 mEq/L (ref 96–112)
Creatinine, Ser: 0.93 mg/dL (ref 0.50–1.35)
GFR calc Af Amer: >90 mL/min (ref >90)
Glucose, Bld: 138 mg/dL — ABNORMAL HIGH (ref 70–99)

## 2012-05-24 NOTE — Progress Notes (Signed)
   Subjective: 1 Day Post-Op Procedure(s) (LRB): TOTAL KNEE ARTHROPLASTY (Right) Patient reports pain as moderate.   Patient seen in rounds with Dr. Lequita Halt. Patient is having problems with pain in the knee, requiring pain medications We will start therapy today.  Plan is to go Home after hospital stay.  Objective: Vital signs in last 24 hours: Temp:  [97.3 F (36.3 C)-98.6 F (37 C)] 98.6 F (37 C) (03/11 0630) Pulse Rate:  [55-73] 73 (03/11 0630) Resp:  [12-20] 16 (03/11 0630) BP: (102-135)/(66-81) 107/77 mmHg (03/11 0630) SpO2:  [95 %-100 %] 97 % (03/11 0630) Weight:  [122.471 kg (270 lb)] 122.471 kg (270 lb) (03/10 1343)  Intake/Output from previous day:  Intake/Output Summary (Last 24 hours) at 05/24/12 0825 Last data filed at 05/24/12 0645  Gross per 24 hour  Intake 3356.67 ml  Output   2745 ml  Net 611.67 ml    Intake/Output this shift: UOP 1125 since Mn +611  Labs:  Recent Labs  05/24/12 0459  HGB 11.5*    Recent Labs  05/24/12 0459  WBC 8.0  RBC 3.76*  HCT 33.0*  PLT 152    Recent Labs  05/24/12 0459  NA 136  K 4.3  CL 101  CO2 27  BUN 14  CREATININE 0.93  GLUCOSE 138*  CALCIUM 8.4   No results found for this basename: LABPT, INR,  in the last 72 hours  EXAM General - Patient is Alert, Appropriate and Oriented Extremity - Neurovascular intact Sensation intact distally Dorsiflexion/Plantar flexion intact Dressing - dressing C/D/I Motor Function - intact, moving foot and toes well on exam.  Hemovac pulled without difficulty.  Past Medical History  Diagnosis Date  . Headache     tx. migraines-"Metoprolol"  . Depression   . GERD (gastroesophageal reflux disease)   . H/O hiatal hernia   . Arthritis     osteoarthritis -knee    Assessment/Plan: 1 Day Post-Op Procedure(s) (LRB): TOTAL KNEE ARTHROPLASTY (Right) Principal Problem:   OA (osteoarthritis) of knee Active Problems:   Postoperative anemia due to acute blood  loss  Estimated body mass index is 36.61 kg/(m^2) as calculated from the following:   Height as of this encounter: 6' (1.829 m).   Weight as of this encounter: 122.471 kg (270 lb). Advance diet Up with therapy Plan for discharge tomorrow Discharge home with home health  DVT Prophylaxis - Xarelto Weight-Bearing as tolerated to right leg No vaccines. D/C O2 and Pulse OX and try on Room Air  PERKINS, ALEXZANDREW 05/24/2012, 8:25 AM

## 2012-05-24 NOTE — Progress Notes (Signed)
Physical Therapy Treatment Patient Details Name: Craig Rice MRN: [id number] DOB: 21-Sep-1945 Today's Date: 05/24/2012 Time: 4098-1191 PT Time Calculation (min): 23 min  PT Assessment / Plan / Recommendation Comments on Treatment Session  Progressing with mobility. Plan is for d/c home tomorrow. Will need to practice steps. Recommend HHPT.     Follow Up Recommendations  Home health PT     Does the patient have the potential to tolerate intense rehabilitation     Barriers to Discharge        Equipment Recommendations  Rolling walker with 5" wheels    Recommendations for Other Services OT consult  Frequency 7X/week   Plan Discharge plan remains appropriate    Precautions / Restrictions Precautions Precautions: Knee;Fall Required Braces or Orthoses: Knee Immobilizer - Right Knee Immobilizer - Right: Discontinue once straight leg raise with < 10 degree lag Restrictions Weight Bearing Restrictions: No RLE Weight Bearing: Weight bearing as tolerated   Pertinent Vitals/Pain 5/10 R knee    Mobility  Bed Mobility Bed Mobility: Sit to Supine Sit to Supine: 4: Min assist Details for Bed Mobility Assistance: assist for r le onto bed Transfers Transfers: Sit to Stand;Stand to Sit Sit to Stand: 4: Min assist;From chair/3-in-1 Stand to Sit: 4: Min guard;To bed Details for Transfer Assistance: VCs safety, technique, hand placement. Assist to rise, stabilize Ambulation/Gait Ambulation/Gait Assistance: 4: Min assist Ambulation Distance (Feet): 55 Feet Assistive device: Rolling walker Ambulation/Gait Assistance Details: VCS safety, sequence, step legnth. Slow gait speed.  Gait Pattern: Antalgic;Decreased step length - right;Decreased stride length    Exercises Total Joint Exercises Ankle Circles/Pumps: AROM;Both;10 reps;Supine Quad Sets: AROM;Both;10 reps;Supine Short Arc Quad: AAROM;Right;10 reps;Supine Heel Slides: AAROM;Right;10 reps;Supine Hip ABduction/ADduction:  AAROM;Right;10 reps;Supine Straight Leg Raises: AAROM;Right;10 reps;Supine   PT Diagnosis:    PT Problem List:   PT Treatment Interventions:     PT Goals Acute Rehab PT Goals Pt will go Sit to Supine/Side: with supervision PT Goal: Sit to Supine/Side - Progress: Progressing toward goal Pt will go Sit to Stand: with supervision PT Goal: Sit to Stand - Progress: Progressing toward goal Pt will Ambulate: 51 - 150 feet;with supervision;with rolling walker PT Goal: Ambulate - Progress: Progressing toward goal Pt will Perform Home Exercise Program: with supervision, verbal cues required/provided PT Goal: Perform Home Exercise Program - Progress: Progressing toward goal  Visit Information  Last PT Received On: 05/24/12 Assistance Needed: +1    Subjective Data  Subjective: i was just about to call for help back to bed Patient Stated Goal: home   Cognition       Balance     End of Session PT - End of Session Equipment Utilized During Treatment: Gait belt;Right knee immobilizer Activity Tolerance: Patient tolerated treatment well Patient left: in bed;with call bell/phone within reach   GP     Rebeca Alert, MPT Pager: [phone]

## 2012-05-24 NOTE — Progress Notes (Signed)
Visited with patient. Offered encouragement and friendship. Had short prayer, offering hope for continuing recovery from surgery.

## 2012-05-24 NOTE — Evaluation (Signed)
Physical Therapy Evaluation Patient Details Name: Craig Rice MRN: [id number] DOB: March 30, 1945 Today's Date: 05/24/2012 Time: 4098-1191 PT Time Calculation (min): 19 min  PT Assessment / Plan / Recommendation Clinical Impression  67 yo male s/p R TKA. On eval pt required min assist for mobility, ambulation ~25 feet. C/o mild dizziness during session. Recommend HHPT, RW.     PT Assessment  Patient needs continued PT services    Follow Up Recommendations  Home health PT    Does the patient have the potential to tolerate intense rehabilitation      Barriers to Discharge        Equipment Recommendations  Rolling walker with 5" wheels    Recommendations for Other Services OT consult   Frequency 7X/week    Precautions / Restrictions Precautions Precautions: Knee;Fall Required Braces or Orthoses: Knee Immobilizer - Right Knee Immobilizer - Right: Discontinue once straight leg raise with < 10 degree lag Restrictions Weight Bearing Restrictions: No RLE Weight Bearing: Weight bearing as tolerated   Pertinent Vitals/Pain 5/10 R knee      Mobility  Bed Mobility Bed Mobility: Supine to Sit Supine to Sit: 4: Min assist Details for Bed Mobility Assistance: assist for R LE off bed Transfers Transfers: Sit to Stand;Stand to Sit Sit to Stand: 4: Min assist;From elevated surface;From bed;With upper extremity assist Stand to Sit: 4: Min assist;To chair/3-in-1;With upper extremity assist;With armrests Details for Transfer Assistance: VCs safety, technique, hand placement. Assist to rise, stabilize, control descent Ambulation/Gait Ambulation/Gait Assistance: 4: Min assist Ambulation Distance (Feet): 25 Feet Assistive device: Rolling walker Ambulation/Gait Assistance Details: VCS safety, sequence, step length. Wife followed with recliner-pt c/o mild dizziness Gait Pattern: Step-to pattern;Antalgic;Decreased stride length;Decreased step length - right    Exercises     PT  Diagnosis: Difficulty walking;Abnormality of gait;Acute pain  PT Problem List: Decreased strength;Decreased range of motion;Decreased activity tolerance;Decreased mobility;Decreased knowledge of use of DME;Decreased knowledge of precautions;Pain PT Treatment Interventions: DME instruction;Gait training;Stair training;Functional mobility training;Therapeutic activities;Therapeutic exercise;Patient/family education   PT Goals Acute Rehab PT Goals PT Goal Formulation: With patient Time For Goal Achievement: 05/31/12 Potential to Achieve Goals: Good Pt will go Supine/Side to Sit: with supervision PT Goal: Supine/Side to Sit - Progress: Goal set today Pt will go Sit to Supine/Side: with supervision PT Goal: Sit to Supine/Side - Progress: Goal set today Pt will go Sit to Stand: with supervision PT Goal: Sit to Stand - Progress: Goal set today Pt will Ambulate: 51 - 150 feet;with supervision;with rolling walker PT Goal: Ambulate - Progress: Goal set today Pt will Go Up / Down Stairs: 1-2 stairs;with rail(s);with least restrictive assistive device PT Goal: Up/Down Stairs - Progress: Goal set today Pt will Perform Home Exercise Program: with supervision, verbal cues required/provided PT Goal: Perform Home Exercise Program - Progress: Goal set today  Visit Information  Last PT Received On: 05/24/12 Assistance Needed: +1    Subjective Data  Subjective: Im a little dizzy Patient Stated Goal: home   Prior Functioning  Home Living Lives With: Spouse Type of Home: House Home Access: Stairs to enter [employer] of Steps: 2 Entrance Stairs-Rails: Right Home Layout: Two level;Able to live on main level with bedroom/bathroom Bathroom Shower/Tub: Walk-in shower (step-in) Home Adaptive Equipment: Straight cane;Crutches Prior Function Level of Independence: Independent Able to Take Stairs?: Yes Driving: Yes Communication Communication: No difficulties    Cognition   Cognition Overall Cognitive Status: Appears within functional limits for tasks assessed/performed Arousal/Alertness: Awake/alert Orientation Level: Appears intact for  tasks assessed Behavior During Session: [facility] for tasks performed    Extremity/Trunk Assessment Right Lower Extremity Assessment RLE ROM/Strength/Tone: Deficits RLE ROM/Strength/Tone Deficits: hip flex 2/5, moves ankle well Left Lower Extremity Assessment LLE ROM/Strength/Tone: WFL for tasks assessed Trunk Assessment Trunk Assessment: Normal   Balance    End of Session PT - End of Session Equipment Utilized During Treatment: Gait belt;Right knee immobilizer Activity Tolerance: Patient tolerated treatment well Patient left: in chair;with call bell/phone within reach;with family/visitor present  GP     Rebeca Alert, MPT Pager: [phone]

## 2012-05-24 NOTE — Progress Notes (Signed)
Received order for rolling walker and commode.  Equipment has been delivered to hospital room.

## 2012-05-25 DIAGNOSIS — E871 Hypo-osmolality and hyponatremia: Secondary | ICD-10-CM

## 2012-05-25 LAB — BASIC METABOLIC PANEL
BUN: 18 mg/dL (ref 6–23)
Calcium: 9.1 mg/dL (ref 8.4–10.5)
Creatinine, Ser: 0.97 mg/dL (ref 0.50–1.35)
GFR calc Af Amer: >90 mL/min (ref >90)
GFR calc non Af Amer: 84 mL/min — ABNORMAL LOW (ref >90)
Glucose, Bld: 154 mg/dL — ABNORMAL HIGH (ref 70–99)
Potassium: 4.6 mEq/L (ref 3.5–5.1)

## 2012-05-25 LAB — CBC
HCT: 32.5 % — ABNORMAL LOW (ref 39.0–52.0)
Hemoglobin: 11.3 g/dL — ABNORMAL LOW (ref 13.0–17.0)
MCH: 30.6 pg (ref 26.0–34.0)
MCHC: 34.8 g/dL (ref 30.0–36.0)
RDW: 12.3 % (ref 11.5–15.5)

## 2012-05-25 MED ORDER — RIVAROXABAN 10 MG PO TABS: 10.0000 mg | ORAL_TABLET | Freq: Every day | ORAL | Status: DC

## 2012-05-25 MED ORDER — METHOCARBAMOL 500 MG PO TABS: 500.0000 mg | ORAL_TABLET | Freq: Four times a day (QID) | ORAL | Status: DC | PRN

## 2012-05-25 MED ORDER — TRAMADOL HCL 50 MG PO TABS: 50.0000 mg | ORAL_TABLET | Freq: Four times a day (QID) | ORAL | Status: DC | PRN

## 2012-05-25 MED ORDER — OXYCODONE HCL 5 MG PO TABS: 5.0000 mg | ORAL_TABLET | ORAL | Status: DC | PRN

## 2012-05-25 NOTE — Progress Notes (Signed)
Pt to d/c home with [facility]. Has all needed DME. AVS reviewed and "My Chart" discussed with pt. Pt capable of verbalizing medications and follow-up appointments. Remains hemodynamically stable. No signs and symptoms of distress. Educated pt to return to ER in the case of SOB, dizziness, or chest pain.

## 2012-05-25 NOTE — Care Management Note (Signed)
    Page 1 of 2   05/25/2012     2:42:05 PM   CARE MANAGEMENT NOTE 05/25/2012  Patient:  Craig Rice, Craig Rice   Account Number:  [id number]  Date Initiated:  05/24/2012  Documentation initiated by:  Colleen Can  Subjective/Objective Assessment:   dx osteoarthritis right knee; total knee replacemnt    Pre-arranged with [facility] for [facility] services; services will start tomorroe 05/26/2012.     Action/Plan:   CM spoke with patient and spouse. Plans are for patient to return to her home in [place] where spouse will be caregiver. She already has RW and 3n1 which was delivered to her room.   Anticipated DC Date:  05/24/2012   Anticipated DC Plan:  HOME W HOME HEALTH SERVICES  In-house referral  Clinical Social Worker      DC Planning Services  CM consult      [facility] Choice  HOME HEALTH  DURABLE MEDICAL EQUIPMENT   Choice offered to / List presented to:  C-1 Patient   DME arranged  3-N-1  Levan Hurst      DME agency  [facility]     HH arranged  HH-2 PT      [facility] agency  [facility]   Status of service:  Completed, signed off Medicare Important Message given?  NA - LOS <3 / Initial given by admissions (If response is "NO", the following Medicare IM given date fields will be blank) Date Medicare IM given:   Date Additional Medicare IM given:    Discharge Disposition:  HOME W HOME HEALTH SERVICES  Per UR Regulation:    If discussed at Long Length of Stay Meetings, dates discussed:    Comments:

## 2012-05-25 NOTE — Discharge Summary (Signed)
Physician Discharge Summary   Patient ID: Craig Rice MRN: [id number] DOB/AGE: [birth year] 67 y.o.  Admit date: 05/23/2012 Discharge date: 05/25/2012  Primary Diagnosis:  Osteoarthritis Right knee  Admission Diagnoses:  Past Medical History  Diagnosis Date  . Headache     tx. migraines-"Metoprolol"  . Depression   . GERD (gastroesophageal reflux disease)   . H/O hiatal hernia   . Arthritis     osteoarthritis -knee   Discharge Diagnoses:   Principal Problem:   OA (osteoarthritis) of knee Active Problems:   Postoperative anemia due to acute blood loss   Postop Hyponatremia  Estimated body mass index is 36.61 kg/(m^2) as calculated from the following:   Height as of this encounter: 6' (1.829 m).   Weight as of this encounter: 122.471 kg (270 lb).  Procedure:  Procedure(s) (LRB): TOTAL KNEE ARTHROPLASTY (Right)   Consults: None  HPI: Craig Rice is a 67 y.o. year old male with end stage OA of his right knee with progressively worsening pain and dysfunction. He has constant pain, with activity and at rest and significant functional deficits with difficulties even with ADLs. He has had extensive non-op management including analgesics, injections of cortisone and viscosupplements, and home exercise program, but remains in significant pain with significant dysfunction. Radiographs show bone on bone arthritis medial and patellofemoral. He presents now for right Total Knee Arthroplasty.   Laboratory Data: Admission on 05/23/2012, Discharged on 05/25/2012  Component Date Value Range Status  . WBC 05/24/2012 8.0  4.0 - 10.5 K/uL Final  . RBC 05/24/2012 3.76* 4.22 - 5.81 MIL/uL Final  . Hemoglobin 05/24/2012 11.5* 13.0 - 17.0 g/dL Final  . HCT 40/98/1191 33.0* 39.0 - 52.0 % Final  . MCV 05/24/2012 87.8  78.0 - 100.0 fL Final  . MCH 05/24/2012 30.6  26.0 - 34.0 pg Final  . MCHC 05/24/2012 34.8  30.0 - 36.0 g/dL Final  . RDW 47/82/9562 12.4  11.5 - 15.5 % Final  . Platelets  05/24/2012 152  150 - 400 K/uL Final  . Sodium 05/24/2012 136  135 - 145 mEq/L Final  . Potassium 05/24/2012 4.3  3.5 - 5.1 mEq/L Final  . Chloride 05/24/2012 101  96 - 112 mEq/L Final  . CO2 05/24/2012 27  19 - 32 mEq/L Final  . Glucose, Bld 05/24/2012 138* 70 - 99 mg/dL Final  . BUN 13/10/6576 14  6 - 23 mg/dL Final  . Creatinine, Ser 05/24/2012 0.93  0.50 - 1.35 mg/dL Final  . Calcium 46/96/2952 8.4  8.4 - 10.5 mg/dL Final  . GFR calc non Af Amer 05/24/2012 86* >90 mL/min Final  . GFR calc Af Amer 05/24/2012 >90  >90 mL/min Final   Comment:                                 The eGFR has been calculated                          using the CKD EPI equation.                          This calculation has not been                          validated in all clinical  situations.                          eGFR's persistently                          <90 mL/min signify                          possible Chronic Kidney Disease.  . WBC 05/25/2012 12.3* 4.0 - 10.5 K/uL Final  . RBC 05/25/2012 3.69* 4.22 - 5.81 MIL/uL Final  . Hemoglobin 05/25/2012 11.3* 13.0 - 17.0 g/dL Final  . HCT 78/29/5621 32.5* 39.0 - 52.0 % Final  . MCV 05/25/2012 88.1  78.0 - 100.0 fL Final  . MCH 05/25/2012 30.6  26.0 - 34.0 pg Final  . MCHC 05/25/2012 34.8  30.0 - 36.0 g/dL Final  . RDW 30/86/5784 12.3  11.5 - 15.5 % Final  . Platelets 05/25/2012 166  150 - 400 K/uL Final  . Sodium 05/25/2012 133* 135 - 145 mEq/L Final  . Potassium 05/25/2012 4.6  3.5 - 5.1 mEq/L Final  . Chloride 05/25/2012 99  96 - 112 mEq/L Final  . CO2 05/25/2012 24  19 - 32 mEq/L Final  . Glucose, Bld 05/25/2012 154* 70 - 99 mg/dL Final  . BUN 69/62/9528 18  6 - 23 mg/dL Final  . Creatinine, Ser 05/25/2012 0.97  0.50 - 1.35 mg/dL Final  . Calcium 41/32/4401 9.1  8.4 - 10.5 mg/dL Final  . GFR calc non Af Amer 05/25/2012 84* >90 mL/min Final  . GFR calc Af Amer 05/25/2012 >90  >90 mL/min Final   Comment:                                  The eGFR has been calculated                          using the CKD EPI equation.                          This calculation has not been                          validated in all clinical                          situations.                          eGFR's persistently                          <90 mL/min signify                          possible Chronic Kidney Disease.  Hospital Outpatient Visit on 05/16/2012  Component Date Value Range Status  . MRSA, PCR 05/16/2012 NEGATIVE  NEGATIVE Final  . Staphylococcus aureus 05/16/2012 POSITIVE* NEGATIVE Final   Comment:                                 The Xpert SA Assay (  FDA                          approved for NASAL specimens                          in patients over 85 years of age),                          is one component of                          a comprehensive surveillance                          program.  Test performance has                          been validated by [employer] for patients greater                          than or equal to 33 year old.                          It is not intended                          to diagnose infection nor to                          guide or monitor treatment.  Marland Kitchen aPTT 05/16/2012 31  24 - 37 seconds Final  . WBC 05/16/2012 6.5  4.0 - 10.5 K/uL Final  . RBC 05/16/2012 4.60  4.22 - 5.81 MIL/uL Final  . Hemoglobin 05/16/2012 14.0  13.0 - 17.0 g/dL Final  . HCT 91/47/8295 40.5  39.0 - 52.0 % Final  . MCV 05/16/2012 88.0  78.0 - 100.0 fL Final  . MCH 05/16/2012 30.4  26.0 - 34.0 pg Final  . MCHC 05/16/2012 34.6  30.0 - 36.0 g/dL Final  . RDW 62/13/0865 12.6  11.5 - 15.5 % Final  . Platelets 05/16/2012 186  150 - 400 K/uL Final  . Sodium 05/16/2012 136  135 - 145 mEq/L Final  . Potassium 05/16/2012 4.2  3.5 - 5.1 mEq/L Final  . Chloride 05/16/2012 102  96 - 112 mEq/L Final  . CO2 05/16/2012 26  19 - 32 mEq/L Final  . Glucose, Bld 05/16/2012 103* 70  - 99 mg/dL Final  . BUN 78/46/9629 19  6 - 23 mg/dL Final  . Creatinine, Ser 05/16/2012 1.05  0.50 - 1.35 mg/dL Final  . Calcium 52/84/1324 9.5  8.4 - 10.5 mg/dL Final  . Total Protein 05/16/2012 7.3  6.0 - 8.3 g/dL Final  . Albumin 40/12/2723 4.1  3.5 - 5.2 g/dL Final  . AST 36/64/4034 28  0 - 37 U/L Final  . ALT 05/16/2012 28  0 - 53 U/L Final  . Alkaline Phosphatase 05/16/2012 74  39 - 117 U/L Final  . Total Bilirubin 05/16/2012 0.6  0.3 - 1.2 mg/dL Final  . GFR calc  non Af Amer 05/16/2012 72* >90 mL/min Final  . GFR calc Af Amer 05/16/2012 83* >90 mL/min Final   Comment:                                 The eGFR has been calculated                          using the CKD EPI equation.                          This calculation has not been                          validated in all clinical                          situations.                          eGFR's persistently                          <90 mL/min signify                          possible Chronic Kidney Disease.  Marland Kitchen Prothrombin Time 05/16/2012 13.0  11.6 - 15.2 seconds Final  . INR 05/16/2012 0.99  0.00 - 1.49 Final  . ABO/RH(D) 05/16/2012 O POS   Final  . Antibody Screen 05/16/2012 NEG   Final  . Sample Expiration 05/16/2012 05/26/2012   Final  . Color, Urine 05/16/2012 YELLOW  YELLOW Final  . APPearance 05/16/2012 CLEAR  CLEAR Final  . Specific Gravity, Urine 05/16/2012 1.025  1.005 - 1.030 Final  . pH 05/16/2012 5.0  5.0 - 8.0 Final  . Glucose, UA 05/16/2012 NEGATIVE  NEGATIVE mg/dL Final  . Hgb urine dipstick 05/16/2012 NEGATIVE  NEGATIVE Final  . Bilirubin Urine 05/16/2012 NEGATIVE  NEGATIVE Final  . Ketones, ur 05/16/2012 NEGATIVE  NEGATIVE mg/dL Final  . Protein, ur 16/12/9602 NEGATIVE  NEGATIVE mg/dL Final  . Urobilinogen, UA 05/16/2012 0.2  0.0 - 1.0 mg/dL Final  . Nitrite 54/11/8117 NEGATIVE  NEGATIVE Final  . Leukocytes, UA 05/16/2012 NEGATIVE  NEGATIVE Final   MICROSCOPIC NOT DONE ON URINES WITH NEGATIVE  PROTEIN, BLOOD, LEUKOCYTES, NITRITE, OR GLUCOSE <1000 mg/dL.  . ABO/RH(D) 05/16/2012 O POS   Final     X-Rays:No results found.  EKG:No orders found for this or any previous visit.   Hospital Course: ALEKSANDER EDMISTON is a 67 y.o. who was admitted to [facility]. They were brought to the operating room on 05/23/2012 and underwent Procedure(s): TOTAL KNEE ARTHROPLASTY.  Patient tolerated the procedure well and was later transferred to the recovery room and then to the orthopaedic floor for postoperative care.  They were given PO and IV analgesics for pain control following their surgery.  They were given 24 hours of postoperative antibiotics of  Anti-infectives   Start     Dose/Rate Route Frequency Ordered Stop   05/23/12 1630  ceFAZolin (ANCEF) IVPB 2 g/50 mL premix     2 g 100 mL/hr over 30 Minutes Intravenous Every 6 hours 05/23/12 1351 05/23/12 2310   05/23/12 0744  ceFAZolin (ANCEF)  3 g in dextrose 5 % 50 mL IVPB     3 g 160 mL/hr over 30 Minutes Intravenous On call to O.R. 05/23/12 7829 05/23/12 1025     and started on DVT prophylaxis in the form of Xarelto.   PT and OT were ordered for total joint protocol.  Discharge planning consulted to help with postop disposition and equipment needs.  Patient had a decent night on the evening of surgery but had some pain.  They started to get up OOB with therapy on day one walking about 25 feet. Hemovac drain was pulled without difficulty.  Continued to work with therapy into day two.  Dressing was changed on day two and the incision was healing well.  Patient was seen in rounds and was ready to go home later that same day.    Discharge Medications: Prior to Admission medications   Medication Sig Start Date End Date Taking? Authorizing Provider  allopurinol (ZYLOPRIM) 100 MG tablet Take 200 mg by mouth daily.   Yes Historical Provider, MD  metoprolol (LOPRESSOR) 50 MG tablet Take 50 mg by mouth daily before breakfast.   Yes Historical  Provider, MD  omeprazole (PRILOSEC OTC) 20 MG tablet Take 20-40 mg by mouth daily.   Yes Historical Provider, MD  PARoxetine (PAXIL) 20 MG tablet Take 20 mg by mouth daily before breakfast.   Yes Historical Provider, MD  methocarbamol (ROBAXIN) 500 MG tablet Take 1 tablet (500 mg total) by mouth every 6 (six) hours as needed. 05/25/12   Alexzandrew Perkins, PA-C  oxyCODONE (OXY IR/ROXICODONE) 5 MG immediate release tablet Take 1-2 tablets (5-10 mg total) by mouth every 3 (three) hours as needed. 05/25/12   Alexzandrew Julien Girt, PA-C  rivaroxaban (XARELTO) 10 MG TABS tablet Take 1 tablet (10 mg total) by mouth daily with breakfast. Take Xarelto for two and a half more weeks, then discontinue Xarelto. 05/25/12   Alexzandrew Perkins, PA-C  traMADol (ULTRAM) 50 MG tablet Take 1-2 tablets (50-100 mg total) by mouth every 6 (six) hours as needed. 05/25/12   Alexzandrew Julien Girt, PA-C    Diet: Regular diet Activity:WBAT Follow-up:in 2 weeks Disposition - Home Discharged Condition: good       Discharge Orders   Future Orders Complete By Expires     Call MD / Call 911  As directed     Comments:      If you experience chest pain or shortness of breath, CALL 911 and be transported to the hospital emergency room.  If you develope a fever above 101 F, pus (white drainage) or increased drainage or redness at the wound, or calf pain, call your surgeon's office.    Change dressing  As directed     Comments:      Change dressing daily with sterile 4 x 4 inch gauze dressing and apply TED hose. Do not submerge the incision under water.    Constipation Prevention  As directed     Comments:      Drink plenty of fluids.  Prune juice may be helpful.  You may use a stool softener, such as Colace (over the counter) 100 mg twice a day.  Use MiraLax (over the counter) for constipation as needed.    Diet general  As directed     Discharge instructions  As directed     Comments:      Pick up stool softner and laxative  for home. Do not submerge incision under water. May shower. Continue to use ice for  pain and swelling from surgery.  Take Xarelto for two and a half more weeks, then discontinue Xarelto.    Do not put a pillow under the knee. Place it under the heel.  As directed     Do not sit on low chairs, stoools or toilet seats, as it may be difficult to get up from low surfaces  As directed     Driving restrictions  As directed     Comments:      No driving until released by the physician.    Increase activity slowly as tolerated  As directed     Lifting restrictions  As directed     Comments:      No lifting until released by the physician.    Patient may shower  As directed     Comments:      You may shower without a dressing once there is no drainage.  Do not wash over the wound.  If drainage remains, do not shower until drainage stops.    TED hose  As directed     Comments:      Use stockings (TED hose) for 3 weeks on both leg(s).  You may remove them at night for sleeping.    Weight bearing as tolerated  As directed         Medication List    STOP taking these medications       naproxen sodium 220 MG tablet  Commonly known as:  ANAPROX      TAKE these medications       allopurinol 100 MG tablet  Commonly known as:  ZYLOPRIM  Take 200 mg by mouth daily.     methocarbamol 500 MG tablet  Commonly known as:  ROBAXIN  Take 1 tablet (500 mg total) by mouth every 6 (six) hours as needed.     metoprolol 50 MG tablet  Commonly known as:  LOPRESSOR  Take 50 mg by mouth daily before breakfast.     omeprazole 20 MG tablet  Commonly known as:  PRILOSEC OTC  Take 20-40 mg by mouth daily.     oxyCODONE 5 MG immediate release tablet  Commonly known as:  Oxy IR/ROXICODONE  Take 1-2 tablets (5-10 mg total) by mouth every 3 (three) hours as needed.     PARoxetine 20 MG tablet  Commonly known as:  PAXIL  Take 20 mg by mouth daily before breakfast.     rivaroxaban 10 MG Tabs tablet    Commonly known as:  XARELTO  Take 1 tablet (10 mg total) by mouth daily with breakfast. Take Xarelto for two and a half more weeks, then discontinue Xarelto.     traMADol 50 MG tablet  Commonly known as:  ULTRAM  Take 1-2 tablets (50-100 mg total) by mouth every 6 (six) hours as needed.         SignedPatrica Duel 06/07/2012, 8:47 AM

## 2012-05-25 NOTE — Evaluation (Signed)
Occupational Therapy Evaluation Patient Details Name: Craig Rice MRN: [id number] DOB: Sep 19, 1945 Today's Date: 05/25/2012 Time: 5284-1324 OT Time Calculation (min): 27 min  OT Assessment / Plan / Recommendation Clinical Impression  Pt presents to OT s/p TKR. All education complete regarding ADL activity s/p TKR.  Pt has needed DME in room    OT Assessment  Patient does not need any further OT services    Follow Up Recommendations  No OT follow up       Equipment Recommendations  3 in 1 bedside comode          Precautions / Restrictions Restrictions Weight Bearing Restrictions: No RLE Weight Bearing: Weight bearing as tolerated       ADL  Upper Body Bathing: Simulated;Set up Where Assessed - Upper Body Bathing: Unsupported sitting Lower Body Bathing: Performed;Min guard;Simulated Where Assessed - Lower Body Bathing: Unsupported sitting Upper Body Dressing: Performed;Set up Where Assessed - Upper Body Dressing: Unsupported sitting Lower Body Dressing: Performed;Min guard Where Assessed - Lower Body Dressing: Unsupported sit to stand Toilet Transfer: Performed;Supervision/safety Toilet Transfer Method: Sit to stand;Stand pivot Acupuncturist: Bedside commode Toileting - Clothing Manipulation and Hygiene: Performed;Supervision/safety Where Assessed - Engineer, mining and Hygiene: Standing Tub/Shower Transfer: Simulated;Min guard          Visit Information  Last OT Received On: 05/25/12       Prior Functioning     Home Living Lives With: Spouse Type of Home: House Home Access: Stairs to enter [employer] of Steps: 2 Entrance Stairs-Rails: Right Home Layout: Two level;Able to live on main level with bedroom/bathroom Bathroom Shower/Tub: Walk-in shower (step-in) Bathroom Toilet: Handicapped height Home Adaptive Equipment: Straight cane;Crutches Prior Function Level of Independence: Independent Able to Take Stairs?:  Yes Driving: Yes Communication Communication: No difficulties         Vision/Perception Vision - History Patient Visual Report: No change from baseline   Cognition  Cognition Overall Cognitive Status: Appears within functional limits for tasks assessed/performed Arousal/Alertness: Awake/alert Orientation Level: Appears intact for tasks assessed Behavior During Session: [facility] for tasks performed    Extremity/Trunk Assessment Right Upper Extremity Assessment RUE ROM/Strength/Tone: [facility] for tasks assessed Left Upper Extremity Assessment LUE ROM/Strength/Tone: WFL for tasks assessed     Mobility Bed Mobility Bed Mobility: Supine to Sit Supine to Sit: 5: Supervision Transfers Transfers: Sit to Stand;Stand to Sit Sit to Stand: 5: Supervision;With upper extremity assist;From chair/3-in-1;From toilet Stand to Sit: 5: Supervision;With upper extremity assist;To toilet;To chair/3-in-1           End of Session OT - End of Session Activity Tolerance: Patient tolerated treatment well Patient left: in chair;with call bell/phone within reach CPM Right Knee CPM Right Knee: Off  GO     Alba Cory 05/25/2012, 9:38 AM

## 2012-05-25 NOTE — Progress Notes (Signed)
   Subjective: 2 Days Post-Op Procedure(s) (LRB): TOTAL KNEE ARTHROPLASTY (Right) Patient reports pain as mild.   Patient seen in rounds with Dr. Lequita Halt. Patient is well, and has had no acute complaints or problems Patient is ready to go home later today.  Objective: Vital signs in last 24 hours: Temp:  [98.3 F (36.8 C)-99.3 F (37.4 C)] 98.3 F (36.8 C) (03/12 0747) Pulse Rate:  [88-105] 88 (03/12 0747) Resp:  [16-18] 18 (03/12 1200) BP: (127-149)/(73-85) 129/82 mmHg (03/12 0747) SpO2:  [92 %-97 %] 97 % (03/12 0747)  Intake/Output from previous day:  Intake/Output Summary (Last 24 hours) at 05/25/12 1338 Last data filed at 05/25/12 0856  Gross per 24 hour  Intake    720 ml  Output   2200 ml  Net  -1480 ml    Intake/Output this shift: Total I/O In: 240 [P.O.:240] Out: 400 [Urine:400]  Labs:  Recent Labs  05/24/12 0459 05/25/12 0450  HGB 11.5* 11.3*    Recent Labs  05/24/12 0459 05/25/12 0450  WBC 8.0 12.3*  RBC 3.76* 3.69*  HCT 33.0* 32.5*  PLT 152 166    Recent Labs  05/24/12 0459 05/25/12 0450  NA 136 133*  K 4.3 4.6  CL 101 99  CO2 27 24  BUN 14 18  CREATININE 0.93 0.97  GLUCOSE 138* 154*  CALCIUM 8.4 9.1   No results found for this basename: LABPT, INR,  in the last 72 hours  EXAM: General - Patient is Alert, Appropriate and Oriented Extremity - Neurovascular intact Sensation intact distally Intact pulses distally No cellulitis present Incision - clean, dry, no drainage, healing Motor Function - intact, moving foot and toes well on exam.   Assessment/Plan: 2 Days Post-Op Procedure(s) (LRB): TOTAL KNEE ARTHROPLASTY (Right) Procedure(s) (LRB): TOTAL KNEE ARTHROPLASTY (Right) Past Medical History  Diagnosis Date  . Headache     tx. migraines-"Metoprolol"  . Depression   . GERD (gastroesophageal reflux disease)   . H/O hiatal hernia   . Arthritis     osteoarthritis -knee   Principal Problem:   OA (osteoarthritis) of  knee Active Problems:   Postoperative anemia due to acute blood loss   Postop Hyponatremia  Estimated body mass index is 36.61 kg/(m^2) as calculated from the following:   Height as of this encounter: 6' (1.829 m).   Weight as of this encounter: 122.471 kg (270 lb). Discharge home with home health Diet - Regular diet Follow up - in 2 weeks Activity - WBAT Disposition - Home Condition Upon Discharge - Good D/C Meds - See DC Summary DVT Prophylaxis - Xarelto  PERKINS, ALEXZANDREW 05/25/2012, 1:38 PM

## 2012-05-25 NOTE — Progress Notes (Signed)
Physical Therapy Treatment Patient Details Name: Craig Rice MRN: [id number] DOB: 04-Dec-1945 Today's Date: 05/25/2012 Time: 4098-1191 PT Time Calculation (min): 42 min  PT Assessment / Plan / Recommendation Comments on Treatment Session  Pt planning to d/c home on today. Reviewed/practiced ambulation, stair negotiation, and exercises. Issued handouts for exercises, steps. Instructed pt to perform exercises one more time this evening at home. All education completed. Pt is ready to d/c home from PT standpoint.     Follow Up Recommendations  Home health PT     Does the patient have the potential to tolerate intense rehabilitation     Barriers to Discharge        Equipment Recommendations  Rolling walker with 5" wheels    Recommendations for Other Services OT consult  Frequency 7X/week   Plan Discharge plan remains appropriate    Precautions / Restrictions Precautions Precautions: Knee;Fall Required Braces or Orthoses: Knee Immobilizer - Right Knee Immobilizer - Right: Discontinue once straight leg raise with < 10 degree lag Restrictions Weight Bearing Restrictions: No RLE Weight Bearing: Weight bearing as tolerated   Pertinent Vitals/Pain 3/10 R knee    Mobility  Transfers Transfers: Sit to Stand;Stand to Sit Sit to Stand: 5: Supervision Stand to Sit: 5: Supervision;To chair/3-in-1 Details for Transfer Assistance: VCs safety, technique, hand placement.  Ambulation/Gait Ambulation/Gait Assistance: 4: Min guard Ambulation Distance (Feet): 150 Feet Assistive device: Rolling walker Ambulation/Gait Assistance Details: VCS safety,sequence, step length. slow gait speed Gait Pattern: Step-to pattern;Step-through pattern;Antalgic;Decreased stride length Stairs: Yes Stairs Assistance: 4: Min assist Stairs Assistance Details (indicate cue type and reason): VCs safety, technique, sequence. Assist to stabilize. Pt had some difficulty with sequencing even with cues. Issued handout  and reviewed again after practicing.  Stair Management Technique: One rail Right;Step to pattern;Forwards;With crutches Number of Stairs: 2 (x2)    Exercises Total Joint Exercises Ankle Circles/Pumps: AROM;Both;10 reps;Seated Quad Sets: AROM;Both;10 reps;Seated Hip ABduction/ADduction: AROM;Right;10 reps;Seated Straight Leg Raises: AAROM;Right;10 reps;Seated Knee Flexion: AAROM;Right;10 reps;Seated (pt used pillowcase to aid in sliding)   PT Diagnosis:    PT Problem List:   PT Treatment Interventions:     PT Goals Acute Rehab PT Goals Pt will go Sit to Stand: with supervision PT Goal: Sit to Stand - Progress: Progressing toward goal Pt will Ambulate: 51 - 150 feet;with supervision;with rolling walker PT Goal: Ambulate - Progress: Progressing toward goal Pt will Go Up / Down Stairs: 1-2 stairs;with least restrictive assistive device;with min assist PT Goal: Up/Down Stairs - Progress: Progressing toward goal Pt will Perform Home Exercise Program: with supervision, verbal cues required/provided PT Goal: Perform Home Exercise Program - Progress: Progressing toward goal  Visit Information  Last PT Received On: 05/25/12 Assistance Needed: +1    Subjective Data  Subjective: Im ready to go now, if I can Patient Stated Goal: home   Cognition  Cognition Overall Cognitive Status: Appears within functional limits for tasks assessed/performed Arousal/Alertness: Awake/alert Orientation Level: Appears intact for tasks assessed Behavior During Session: [facility] for tasks performed    Balance     End of Session PT - End of Session Equipment Utilized During Treatment: Gait belt;Right knee immobilizer Activity Tolerance: Patient tolerated treatment well Patient left: in chair;with call bell/phone within reach   GP     Rebeca Alert, MPT Pager: [phone]

## 2012-05-27 DIAGNOSIS — Z7901 Long term (current) use of anticoagulants: Secondary | ICD-10-CM | POA: Diagnosis not present

## 2012-05-27 DIAGNOSIS — F3289 Other specified depressive episodes: Secondary | ICD-10-CM | POA: Diagnosis not present

## 2012-05-27 DIAGNOSIS — M1710 Unilateral primary osteoarthritis, unspecified knee: Secondary | ICD-10-CM | POA: Diagnosis not present

## 2012-05-27 DIAGNOSIS — Z471 Aftercare following joint replacement surgery: Secondary | ICD-10-CM | POA: Diagnosis not present

## 2012-05-27 DIAGNOSIS — IMO0002 Reserved for concepts with insufficient information to code with codable children: Secondary | ICD-10-CM | POA: Diagnosis not present

## 2012-05-27 DIAGNOSIS — IMO0001 Reserved for inherently not codable concepts without codable children: Secondary | ICD-10-CM | POA: Diagnosis not present

## 2012-05-27 DIAGNOSIS — Z96659 Presence of unspecified artificial knee joint: Secondary | ICD-10-CM | POA: Diagnosis not present

## 2012-05-30 DIAGNOSIS — F3289 Other specified depressive episodes: Secondary | ICD-10-CM | POA: Diagnosis not present

## 2012-05-30 DIAGNOSIS — Z96659 Presence of unspecified artificial knee joint: Secondary | ICD-10-CM | POA: Diagnosis not present

## 2012-05-30 DIAGNOSIS — Z7901 Long term (current) use of anticoagulants: Secondary | ICD-10-CM | POA: Diagnosis not present

## 2012-05-30 DIAGNOSIS — Z471 Aftercare following joint replacement surgery: Secondary | ICD-10-CM | POA: Diagnosis not present

## 2012-05-30 DIAGNOSIS — IMO0001 Reserved for inherently not codable concepts without codable children: Secondary | ICD-10-CM | POA: Diagnosis not present

## 2012-05-31 DIAGNOSIS — F3289 Other specified depressive episodes: Secondary | ICD-10-CM | POA: Diagnosis not present

## 2012-05-31 DIAGNOSIS — Z471 Aftercare following joint replacement surgery: Secondary | ICD-10-CM | POA: Diagnosis not present

## 2012-05-31 DIAGNOSIS — Z7901 Long term (current) use of anticoagulants: Secondary | ICD-10-CM | POA: Diagnosis not present

## 2012-05-31 DIAGNOSIS — Z96659 Presence of unspecified artificial knee joint: Secondary | ICD-10-CM | POA: Diagnosis not present

## 2012-05-31 DIAGNOSIS — IMO0001 Reserved for inherently not codable concepts without codable children: Secondary | ICD-10-CM | POA: Diagnosis not present

## 2012-06-01 DIAGNOSIS — F3289 Other specified depressive episodes: Secondary | ICD-10-CM | POA: Diagnosis not present

## 2012-06-01 DIAGNOSIS — Z471 Aftercare following joint replacement surgery: Secondary | ICD-10-CM | POA: Diagnosis not present

## 2012-06-01 DIAGNOSIS — Z96659 Presence of unspecified artificial knee joint: Secondary | ICD-10-CM | POA: Diagnosis not present

## 2012-06-01 DIAGNOSIS — Z7901 Long term (current) use of anticoagulants: Secondary | ICD-10-CM | POA: Diagnosis not present

## 2012-06-01 DIAGNOSIS — IMO0001 Reserved for inherently not codable concepts without codable children: Secondary | ICD-10-CM | POA: Diagnosis not present

## 2012-06-02 DIAGNOSIS — Z96659 Presence of unspecified artificial knee joint: Secondary | ICD-10-CM | POA: Diagnosis not present

## 2012-06-02 DIAGNOSIS — IMO0001 Reserved for inherently not codable concepts without codable children: Secondary | ICD-10-CM | POA: Diagnosis not present

## 2012-06-02 DIAGNOSIS — F3289 Other specified depressive episodes: Secondary | ICD-10-CM | POA: Diagnosis not present

## 2012-06-02 DIAGNOSIS — Z471 Aftercare following joint replacement surgery: Secondary | ICD-10-CM | POA: Diagnosis not present

## 2012-06-02 DIAGNOSIS — Z7901 Long term (current) use of anticoagulants: Secondary | ICD-10-CM | POA: Diagnosis not present

## 2012-06-03 DIAGNOSIS — F329 Major depressive disorder, single episode, unspecified: Secondary | ICD-10-CM | POA: Diagnosis not present

## 2012-06-03 DIAGNOSIS — IMO0001 Reserved for inherently not codable concepts without codable children: Secondary | ICD-10-CM | POA: Diagnosis not present

## 2012-06-03 DIAGNOSIS — Z96659 Presence of unspecified artificial knee joint: Secondary | ICD-10-CM | POA: Diagnosis not present

## 2012-06-03 DIAGNOSIS — Z471 Aftercare following joint replacement surgery: Secondary | ICD-10-CM | POA: Diagnosis not present

## 2012-06-03 DIAGNOSIS — Z7901 Long term (current) use of anticoagulants: Secondary | ICD-10-CM | POA: Diagnosis not present

## 2012-06-03 DIAGNOSIS — F3289 Other specified depressive episodes: Secondary | ICD-10-CM | POA: Diagnosis not present

## 2012-06-06 DIAGNOSIS — F3289 Other specified depressive episodes: Secondary | ICD-10-CM | POA: Diagnosis not present

## 2012-06-06 DIAGNOSIS — Z96659 Presence of unspecified artificial knee joint: Secondary | ICD-10-CM | POA: Diagnosis not present

## 2012-06-06 DIAGNOSIS — IMO0001 Reserved for inherently not codable concepts without codable children: Secondary | ICD-10-CM | POA: Diagnosis not present

## 2012-06-06 DIAGNOSIS — Z471 Aftercare following joint replacement surgery: Secondary | ICD-10-CM | POA: Diagnosis not present

## 2012-06-06 DIAGNOSIS — Z7901 Long term (current) use of anticoagulants: Secondary | ICD-10-CM | POA: Diagnosis not present

## 2012-06-06 DIAGNOSIS — F329 Major depressive disorder, single episode, unspecified: Secondary | ICD-10-CM | POA: Diagnosis not present

## 2012-06-08 DIAGNOSIS — Z96659 Presence of unspecified artificial knee joint: Secondary | ICD-10-CM | POA: Diagnosis not present

## 2012-06-08 DIAGNOSIS — F3289 Other specified depressive episodes: Secondary | ICD-10-CM | POA: Diagnosis not present

## 2012-06-08 DIAGNOSIS — F329 Major depressive disorder, single episode, unspecified: Secondary | ICD-10-CM | POA: Diagnosis not present

## 2012-06-08 DIAGNOSIS — Z471 Aftercare following joint replacement surgery: Secondary | ICD-10-CM | POA: Diagnosis not present

## 2012-06-08 DIAGNOSIS — Z7901 Long term (current) use of anticoagulants: Secondary | ICD-10-CM | POA: Diagnosis not present

## 2012-06-08 DIAGNOSIS — IMO0001 Reserved for inherently not codable concepts without codable children: Secondary | ICD-10-CM | POA: Diagnosis not present

## 2012-06-09 DIAGNOSIS — Z96659 Presence of unspecified artificial knee joint: Secondary | ICD-10-CM | POA: Diagnosis not present

## 2012-06-09 DIAGNOSIS — F3289 Other specified depressive episodes: Secondary | ICD-10-CM | POA: Diagnosis not present

## 2012-06-09 DIAGNOSIS — IMO0001 Reserved for inherently not codable concepts without codable children: Secondary | ICD-10-CM | POA: Diagnosis not present

## 2012-06-09 DIAGNOSIS — Z471 Aftercare following joint replacement surgery: Secondary | ICD-10-CM | POA: Diagnosis not present

## 2012-06-09 DIAGNOSIS — Z7901 Long term (current) use of anticoagulants: Secondary | ICD-10-CM | POA: Diagnosis not present

## 2012-06-09 DIAGNOSIS — F329 Major depressive disorder, single episode, unspecified: Secondary | ICD-10-CM | POA: Diagnosis not present

## 2012-06-10 DIAGNOSIS — Z96659 Presence of unspecified artificial knee joint: Secondary | ICD-10-CM | POA: Diagnosis not present

## 2012-06-10 DIAGNOSIS — IMO0001 Reserved for inherently not codable concepts without codable children: Secondary | ICD-10-CM | POA: Diagnosis not present

## 2012-06-10 DIAGNOSIS — F3289 Other specified depressive episodes: Secondary | ICD-10-CM | POA: Diagnosis not present

## 2012-06-10 DIAGNOSIS — Z471 Aftercare following joint replacement surgery: Secondary | ICD-10-CM | POA: Diagnosis not present

## 2012-06-10 DIAGNOSIS — Z7901 Long term (current) use of anticoagulants: Secondary | ICD-10-CM | POA: Diagnosis not present

## 2012-06-10 DIAGNOSIS — F329 Major depressive disorder, single episode, unspecified: Secondary | ICD-10-CM | POA: Diagnosis not present

## 2012-06-13 DIAGNOSIS — M1710 Unilateral primary osteoarthritis, unspecified knee: Secondary | ICD-10-CM | POA: Diagnosis not present

## 2012-06-13 DIAGNOSIS — IMO0002 Reserved for concepts with insufficient information to code with codable children: Secondary | ICD-10-CM | POA: Diagnosis not present

## 2012-06-16 DIAGNOSIS — IMO0002 Reserved for concepts with insufficient information to code with codable children: Secondary | ICD-10-CM | POA: Diagnosis not present

## 2012-06-16 DIAGNOSIS — M1710 Unilateral primary osteoarthritis, unspecified knee: Secondary | ICD-10-CM | POA: Diagnosis not present

## 2012-06-20 DIAGNOSIS — IMO0002 Reserved for concepts with insufficient information to code with codable children: Secondary | ICD-10-CM | POA: Diagnosis not present

## 2012-06-20 DIAGNOSIS — M1710 Unilateral primary osteoarthritis, unspecified knee: Secondary | ICD-10-CM | POA: Diagnosis not present

## 2012-06-23 DIAGNOSIS — IMO0002 Reserved for concepts with insufficient information to code with codable children: Secondary | ICD-10-CM | POA: Diagnosis not present

## 2012-06-23 DIAGNOSIS — M1710 Unilateral primary osteoarthritis, unspecified knee: Secondary | ICD-10-CM | POA: Diagnosis not present

## 2012-06-27 DIAGNOSIS — M1710 Unilateral primary osteoarthritis, unspecified knee: Secondary | ICD-10-CM | POA: Diagnosis not present

## 2012-06-27 DIAGNOSIS — IMO0002 Reserved for concepts with insufficient information to code with codable children: Secondary | ICD-10-CM | POA: Diagnosis not present

## 2012-06-28 DIAGNOSIS — Z96659 Presence of unspecified artificial knee joint: Secondary | ICD-10-CM | POA: Diagnosis not present

## 2012-06-30 DIAGNOSIS — M1710 Unilateral primary osteoarthritis, unspecified knee: Secondary | ICD-10-CM | POA: Diagnosis not present

## 2012-06-30 DIAGNOSIS — IMO0002 Reserved for concepts with insufficient information to code with codable children: Secondary | ICD-10-CM | POA: Diagnosis not present

## 2012-07-04 DIAGNOSIS — M1710 Unilateral primary osteoarthritis, unspecified knee: Secondary | ICD-10-CM | POA: Diagnosis not present

## 2012-07-04 DIAGNOSIS — IMO0002 Reserved for concepts with insufficient information to code with codable children: Secondary | ICD-10-CM | POA: Diagnosis not present

## 2012-07-07 DIAGNOSIS — M1710 Unilateral primary osteoarthritis, unspecified knee: Secondary | ICD-10-CM | POA: Diagnosis not present

## 2012-07-07 DIAGNOSIS — IMO0002 Reserved for concepts with insufficient information to code with codable children: Secondary | ICD-10-CM | POA: Diagnosis not present

## 2012-09-01 IMAGING — US US AORTA
1 series · 14 of 15 positions shown · non-contrast
Comparison: None.

CLINICAL DATA: Smoker.  Family history of abdominal aortic
aneurysm.

ABDOMINAL AORTA SCREENING ULTRASOUND
TECHNIQUE: Ultrasound examination of the abdominal aorta was
performed as a screening evaluation for abdominal aortic aneurysm.

[Series 1: us aorta · 0.37mm/px · 14 of 15 slices shown]
[im 1/15]
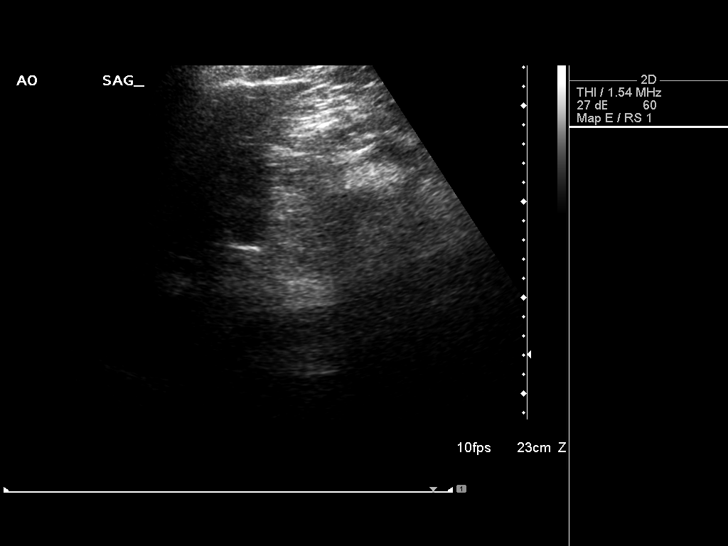
[im 2/15]
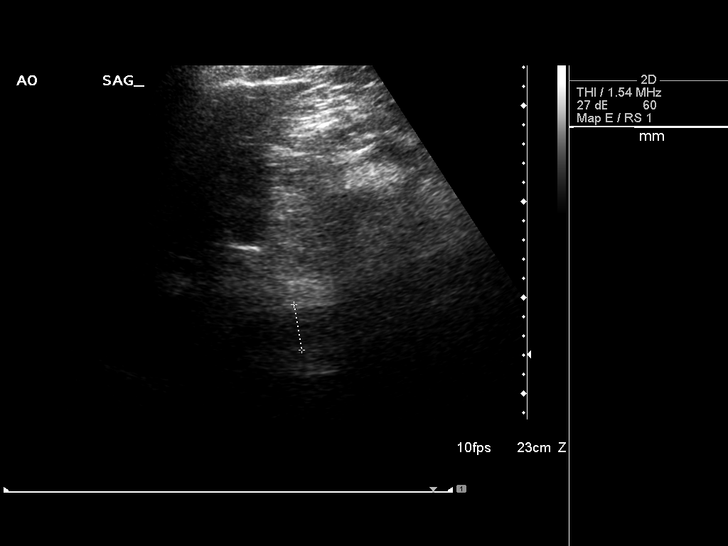
[im 3/15]
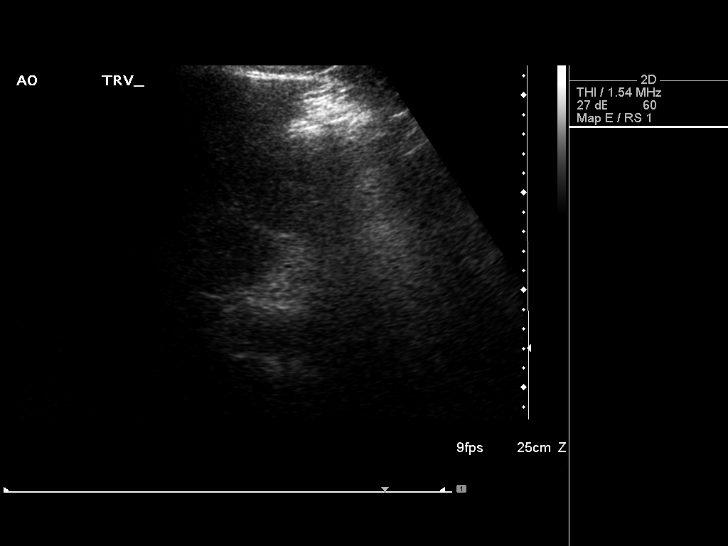
[im 4/15]
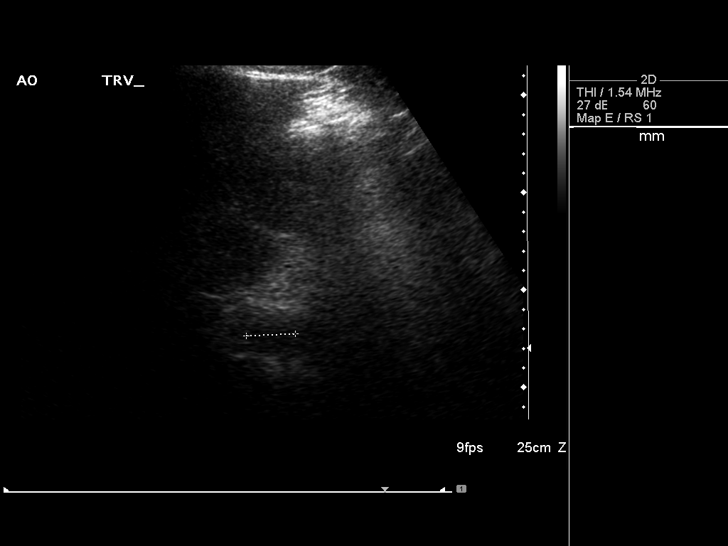
[im 5/15]
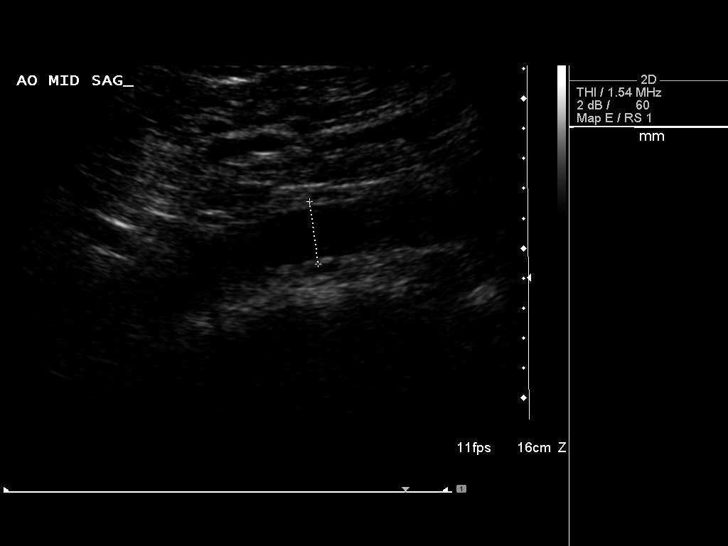
[im 6/15]
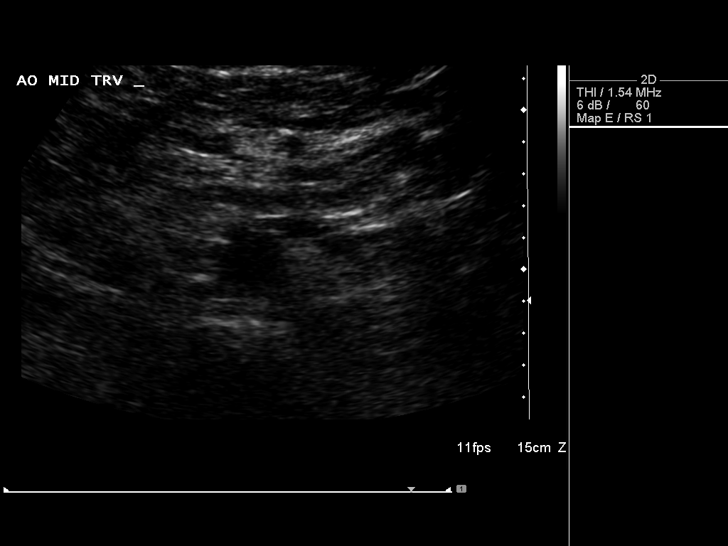
[im 7/15]
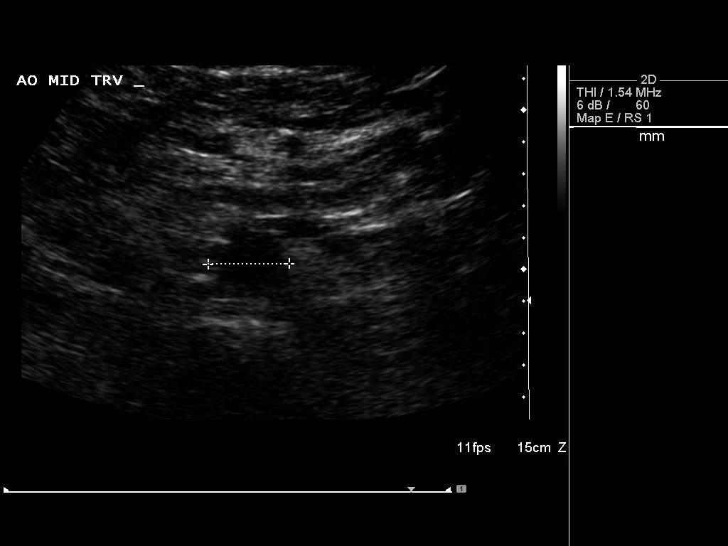
[im 9/15]
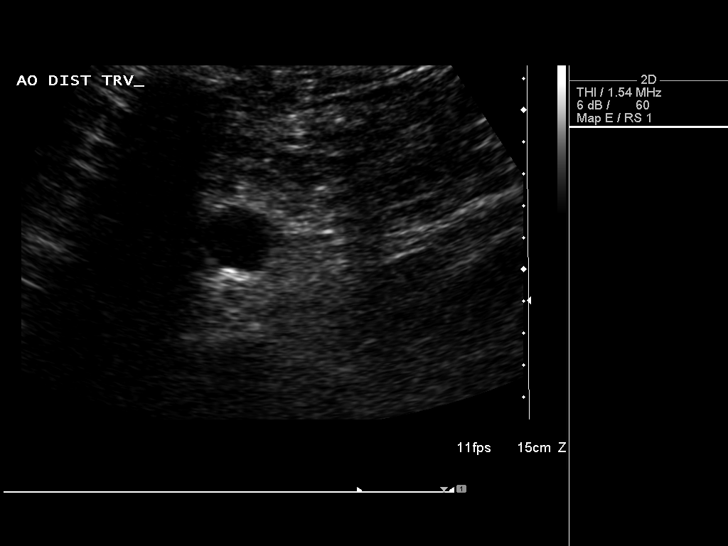
[im 10/15]
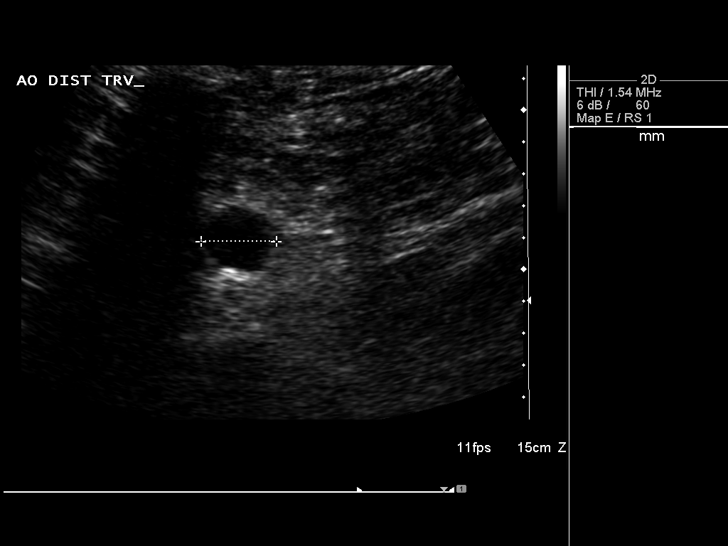
[im 11/15]
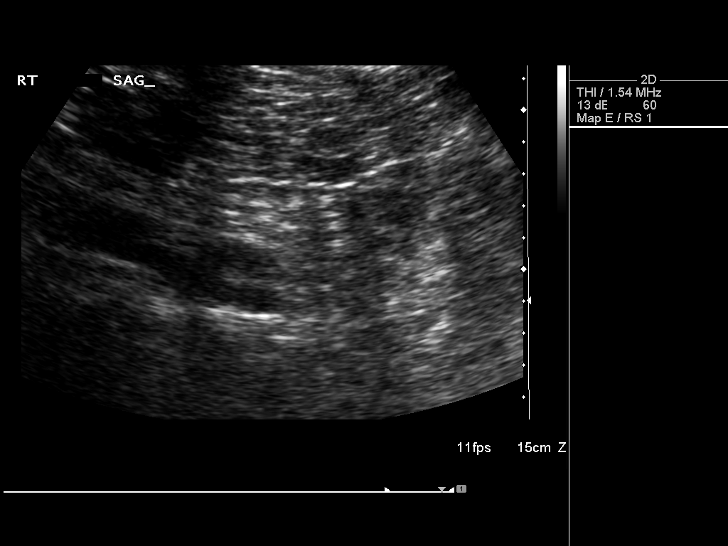
[im 12/15]
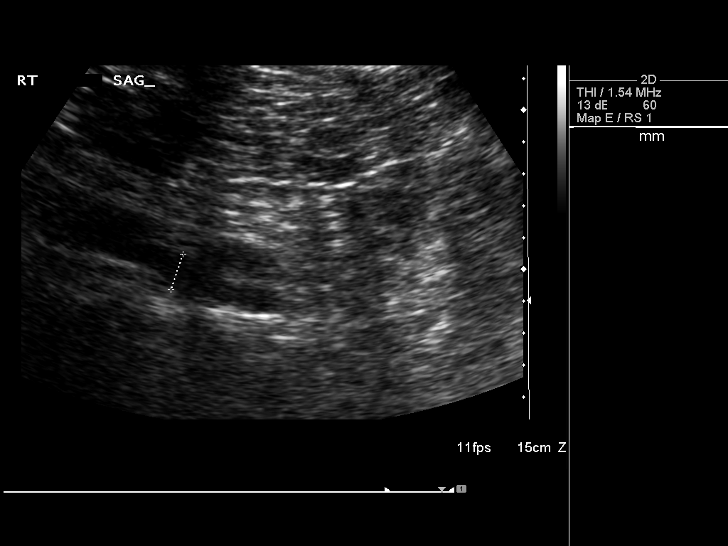
[im 13/15]
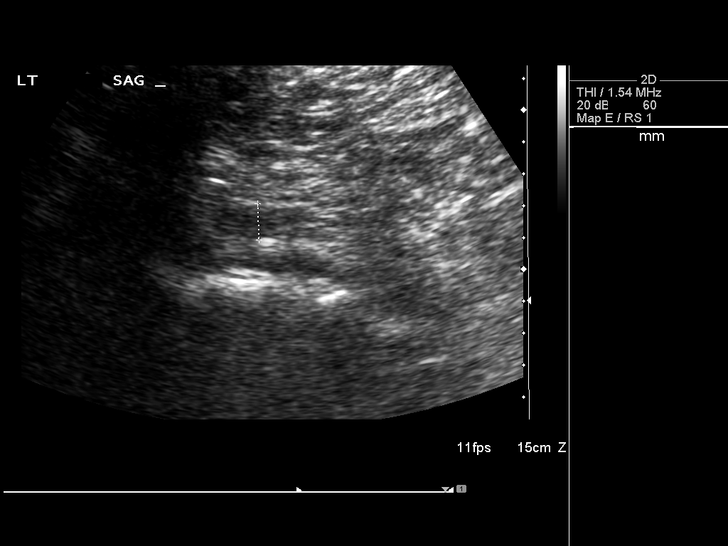
[im 14/15]
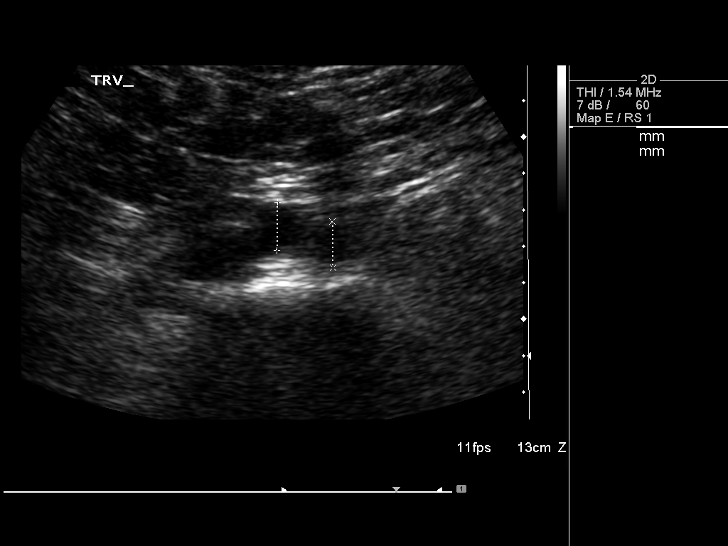
[im 15/15]
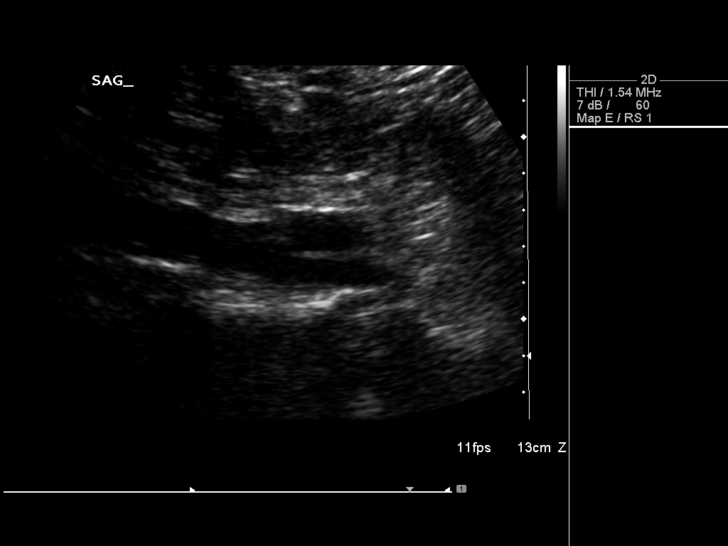

[14 of 15 positions shown; findings below may reference images not displayed]

Abdominal Aorta: A

      Maximum AP diameter:  2.5 cm.
      Maximum TRV diameter:  2.6 cm.
IMPRESSION: No abdominal aortic aneurysm identified.

## 2012-09-12 DIAGNOSIS — J329 Chronic sinusitis, unspecified: Secondary | ICD-10-CM | POA: Diagnosis not present

## 2012-09-12 DIAGNOSIS — R0982 Postnasal drip: Secondary | ICD-10-CM | POA: Diagnosis not present

## 2012-09-27 DIAGNOSIS — Z96659 Presence of unspecified artificial knee joint: Secondary | ICD-10-CM | POA: Diagnosis not present

## 2012-11-23 DIAGNOSIS — M7989 Other specified soft tissue disorders: Secondary | ICD-10-CM | POA: Diagnosis not present

## 2012-11-23 DIAGNOSIS — R509 Fever, unspecified: Secondary | ICD-10-CM | POA: Diagnosis not present

## 2012-11-23 DIAGNOSIS — L02419 Cutaneous abscess of limb, unspecified: Secondary | ICD-10-CM | POA: Diagnosis not present

## 2012-11-23 DIAGNOSIS — M25569 Pain in unspecified knee: Secondary | ICD-10-CM | POA: Diagnosis not present

## 2012-11-24 DIAGNOSIS — R509 Fever, unspecified: Secondary | ICD-10-CM | POA: Diagnosis not present

## 2012-11-24 DIAGNOSIS — M7989 Other specified soft tissue disorders: Secondary | ICD-10-CM | POA: Diagnosis not present

## 2012-11-24 DIAGNOSIS — M25569 Pain in unspecified knee: Secondary | ICD-10-CM | POA: Diagnosis not present

## 2012-11-24 DIAGNOSIS — L02419 Cutaneous abscess of limb, unspecified: Secondary | ICD-10-CM | POA: Diagnosis not present

## 2012-12-19 DIAGNOSIS — I10 Essential (primary) hypertension: Secondary | ICD-10-CM | POA: Diagnosis not present

## 2012-12-19 DIAGNOSIS — Z23 Encounter for immunization: Secondary | ICD-10-CM | POA: Diagnosis not present

## 2012-12-19 DIAGNOSIS — M109 Gout, unspecified: Secondary | ICD-10-CM | POA: Diagnosis not present

## 2012-12-22 DIAGNOSIS — I10 Essential (primary) hypertension: Secondary | ICD-10-CM | POA: Diagnosis not present

## 2012-12-22 DIAGNOSIS — M109 Gout, unspecified: Secondary | ICD-10-CM | POA: Diagnosis not present

## 2012-12-22 DIAGNOSIS — F329 Major depressive disorder, single episode, unspecified: Secondary | ICD-10-CM | POA: Diagnosis not present

## 2012-12-22 DIAGNOSIS — R7301 Impaired fasting glucose: Secondary | ICD-10-CM | POA: Diagnosis not present

## 2012-12-22 DIAGNOSIS — F3289 Other specified depressive episodes: Secondary | ICD-10-CM | POA: Diagnosis not present

## 2012-12-22 DIAGNOSIS — E785 Hyperlipidemia, unspecified: Secondary | ICD-10-CM | POA: Diagnosis not present

## 2012-12-22 DIAGNOSIS — G4730 Sleep apnea, unspecified: Secondary | ICD-10-CM | POA: Diagnosis not present

## 2012-12-22 DIAGNOSIS — Z0000 Encounter for general adult medical examination without abnormal findings: Secondary | ICD-10-CM | POA: Diagnosis not present

## 2012-12-22 DIAGNOSIS — K219 Gastro-esophageal reflux disease without esophagitis: Secondary | ICD-10-CM | POA: Diagnosis not present

## 2013-01-19 DIAGNOSIS — B081 Molluscum contagiosum: Secondary | ICD-10-CM | POA: Diagnosis not present

## 2013-01-19 DIAGNOSIS — H04129 Dry eye syndrome of unspecified lacrimal gland: Secondary | ICD-10-CM | POA: Diagnosis not present

## 2013-02-03 DIAGNOSIS — L03019 Cellulitis of unspecified finger: Secondary | ICD-10-CM | POA: Diagnosis not present

## 2013-02-03 DIAGNOSIS — R82998 Other abnormal findings in urine: Secondary | ICD-10-CM | POA: Diagnosis not present

## 2013-05-30 DIAGNOSIS — Z96659 Presence of unspecified artificial knee joint: Secondary | ICD-10-CM | POA: Diagnosis not present

## 2013-05-30 DIAGNOSIS — M1710 Unilateral primary osteoarthritis, unspecified knee: Secondary | ICD-10-CM | POA: Diagnosis not present

## 2013-05-30 DIAGNOSIS — IMO0002 Reserved for concepts with insufficient information to code with codable children: Secondary | ICD-10-CM | POA: Diagnosis not present

## 2013-09-25 DIAGNOSIS — M19049 Primary osteoarthritis, unspecified hand: Secondary | ICD-10-CM | POA: Diagnosis not present

## 2013-12-18 DIAGNOSIS — Z23 Encounter for immunization: Secondary | ICD-10-CM | POA: Diagnosis not present

## 2014-01-24 DIAGNOSIS — H2513 Age-related nuclear cataract, bilateral: Secondary | ICD-10-CM | POA: Diagnosis not present

## 2014-01-31 DIAGNOSIS — Z125 Encounter for screening for malignant neoplasm of prostate: Secondary | ICD-10-CM | POA: Diagnosis not present

## 2014-01-31 DIAGNOSIS — M109 Gout, unspecified: Secondary | ICD-10-CM | POA: Diagnosis not present

## 2014-01-31 DIAGNOSIS — I10 Essential (primary) hypertension: Secondary | ICD-10-CM | POA: Diagnosis not present

## 2014-02-02 DIAGNOSIS — I10 Essential (primary) hypertension: Secondary | ICD-10-CM | POA: Diagnosis not present

## 2014-02-02 DIAGNOSIS — M109 Gout, unspecified: Secondary | ICD-10-CM | POA: Diagnosis not present

## 2014-02-02 DIAGNOSIS — R7301 Impaired fasting glucose: Secondary | ICD-10-CM | POA: Diagnosis not present

## 2014-02-02 DIAGNOSIS — Z0000 Encounter for general adult medical examination without abnormal findings: Secondary | ICD-10-CM | POA: Diagnosis not present

## 2014-02-02 DIAGNOSIS — E785 Hyperlipidemia, unspecified: Secondary | ICD-10-CM | POA: Diagnosis not present

## 2014-02-02 DIAGNOSIS — F329 Major depressive disorder, single episode, unspecified: Secondary | ICD-10-CM | POA: Diagnosis not present

## 2014-02-02 DIAGNOSIS — G4730 Sleep apnea, unspecified: Secondary | ICD-10-CM | POA: Diagnosis not present

## 2014-02-02 DIAGNOSIS — R8290 Unspecified abnormal findings in urine: Secondary | ICD-10-CM | POA: Diagnosis not present

## 2014-02-02 DIAGNOSIS — K219 Gastro-esophageal reflux disease without esophagitis: Secondary | ICD-10-CM | POA: Diagnosis not present

## 2014-03-20 DIAGNOSIS — Z23 Encounter for immunization: Secondary | ICD-10-CM | POA: Diagnosis not present

## 2014-05-14 DIAGNOSIS — R609 Edema, unspecified: Secondary | ICD-10-CM | POA: Diagnosis not present

## 2014-10-31 DIAGNOSIS — Z23 Encounter for immunization: Secondary | ICD-10-CM | POA: Diagnosis not present

## 2014-10-31 DIAGNOSIS — Z79899 Other long term (current) drug therapy: Secondary | ICD-10-CM | POA: Diagnosis not present

## 2014-10-31 DIAGNOSIS — R413 Other amnesia: Secondary | ICD-10-CM | POA: Diagnosis not present

## 2014-10-31 DIAGNOSIS — I10 Essential (primary) hypertension: Secondary | ICD-10-CM | POA: Diagnosis not present

## 2014-12-27 DIAGNOSIS — G2581 Restless legs syndrome: Secondary | ICD-10-CM | POA: Diagnosis not present

## 2014-12-27 DIAGNOSIS — R400 Somnolence: Secondary | ICD-10-CM | POA: Diagnosis not present

## 2015-01-10 DIAGNOSIS — G4733 Obstructive sleep apnea (adult) (pediatric): Secondary | ICD-10-CM | POA: Diagnosis not present

## 2015-01-11 DIAGNOSIS — H5203 Hypermetropia, bilateral: Secondary | ICD-10-CM | POA: Diagnosis not present

## 2015-01-11 DIAGNOSIS — H52223 Regular astigmatism, bilateral: Secondary | ICD-10-CM | POA: Diagnosis not present

## 2015-01-11 DIAGNOSIS — H43819 Vitreous degeneration, unspecified eye: Secondary | ICD-10-CM | POA: Diagnosis not present

## 2015-01-11 DIAGNOSIS — H25813 Combined forms of age-related cataract, bilateral: Secondary | ICD-10-CM | POA: Diagnosis not present

## 2015-03-13 DIAGNOSIS — M109 Gout, unspecified: Secondary | ICD-10-CM | POA: Diagnosis not present

## 2015-03-13 DIAGNOSIS — Z125 Encounter for screening for malignant neoplasm of prostate: Secondary | ICD-10-CM | POA: Diagnosis not present

## 2015-03-20 DIAGNOSIS — G4733 Obstructive sleep apnea (adult) (pediatric): Secondary | ICD-10-CM | POA: Diagnosis not present

## 2015-03-20 DIAGNOSIS — G2581 Restless legs syndrome: Secondary | ICD-10-CM | POA: Diagnosis not present

## 2015-03-20 DIAGNOSIS — E611 Iron deficiency: Secondary | ICD-10-CM | POA: Diagnosis not present

## 2015-03-21 DIAGNOSIS — R8290 Unspecified abnormal findings in urine: Secondary | ICD-10-CM | POA: Diagnosis not present

## 2015-03-21 DIAGNOSIS — E785 Hyperlipidemia, unspecified: Secondary | ICD-10-CM | POA: Diagnosis not present

## 2015-03-21 DIAGNOSIS — R7301 Impaired fasting glucose: Secondary | ICD-10-CM | POA: Diagnosis not present

## 2015-03-21 DIAGNOSIS — N529 Male erectile dysfunction, unspecified: Secondary | ICD-10-CM | POA: Diagnosis not present

## 2015-03-21 DIAGNOSIS — Z0000 Encounter for general adult medical examination without abnormal findings: Secondary | ICD-10-CM | POA: Diagnosis not present

## 2015-03-21 DIAGNOSIS — G4730 Sleep apnea, unspecified: Secondary | ICD-10-CM | POA: Diagnosis not present

## 2015-03-21 DIAGNOSIS — M109 Gout, unspecified: Secondary | ICD-10-CM | POA: Diagnosis not present

## 2015-03-21 DIAGNOSIS — I10 Essential (primary) hypertension: Secondary | ICD-10-CM | POA: Diagnosis not present

## 2015-03-21 DIAGNOSIS — F338 Other recurrent depressive disorders: Secondary | ICD-10-CM | POA: Diagnosis not present

## 2015-08-29 DIAGNOSIS — W108XXA Fall (on) (from) other stairs and steps, initial encounter: Secondary | ICD-10-CM | POA: Diagnosis not present

## 2015-08-29 DIAGNOSIS — Y92520 Airport as the place of occurrence of the external cause: Secondary | ICD-10-CM | POA: Diagnosis not present

## 2015-08-29 DIAGNOSIS — S63255A Unspecified dislocation of left ring finger, initial encounter: Secondary | ICD-10-CM | POA: Diagnosis not present

## 2015-09-10 DIAGNOSIS — S63275A Dislocation of unspecified interphalangeal joint of left ring finger, initial encounter: Secondary | ICD-10-CM | POA: Diagnosis not present

## 2015-10-28 DIAGNOSIS — L03031 Cellulitis of right toe: Secondary | ICD-10-CM | POA: Diagnosis not present

## 2016-01-07 DIAGNOSIS — Z23 Encounter for immunization: Secondary | ICD-10-CM | POA: Diagnosis not present

## 2016-03-23 DIAGNOSIS — G4733 Obstructive sleep apnea (adult) (pediatric): Secondary | ICD-10-CM | POA: Diagnosis not present

## 2016-04-10 DIAGNOSIS — R8290 Unspecified abnormal findings in urine: Secondary | ICD-10-CM | POA: Diagnosis not present

## 2016-04-10 DIAGNOSIS — Z0000 Encounter for general adult medical examination without abnormal findings: Secondary | ICD-10-CM | POA: Diagnosis not present

## 2016-04-10 DIAGNOSIS — Z8739 Personal history of other diseases of the musculoskeletal system and connective tissue: Secondary | ICD-10-CM | POA: Diagnosis not present

## 2016-04-10 DIAGNOSIS — E785 Hyperlipidemia, unspecified: Secondary | ICD-10-CM | POA: Diagnosis not present

## 2016-04-10 DIAGNOSIS — K219 Gastro-esophageal reflux disease without esophagitis: Secondary | ICD-10-CM | POA: Diagnosis not present

## 2016-04-10 DIAGNOSIS — G2581 Restless legs syndrome: Secondary | ICD-10-CM | POA: Diagnosis not present

## 2016-04-10 DIAGNOSIS — G4730 Sleep apnea, unspecified: Secondary | ICD-10-CM | POA: Diagnosis not present

## 2016-04-10 DIAGNOSIS — Z125 Encounter for screening for malignant neoplasm of prostate: Secondary | ICD-10-CM | POA: Diagnosis not present

## 2016-04-10 DIAGNOSIS — Z1389 Encounter for screening for other disorder: Secondary | ICD-10-CM | POA: Diagnosis not present

## 2016-04-10 DIAGNOSIS — R7301 Impaired fasting glucose: Secondary | ICD-10-CM | POA: Diagnosis not present

## 2016-04-10 DIAGNOSIS — I10 Essential (primary) hypertension: Secondary | ICD-10-CM | POA: Diagnosis not present

## 2016-04-10 DIAGNOSIS — F338 Other recurrent depressive disorders: Secondary | ICD-10-CM | POA: Diagnosis not present

## 2016-04-10 DIAGNOSIS — M65322 Trigger finger, left index finger: Secondary | ICD-10-CM | POA: Diagnosis not present

## 2016-04-24 DIAGNOSIS — H40033 Anatomical narrow angle, bilateral: Secondary | ICD-10-CM | POA: Diagnosis not present

## 2016-04-24 DIAGNOSIS — H25813 Combined forms of age-related cataract, bilateral: Secondary | ICD-10-CM | POA: Diagnosis not present

## 2016-04-24 DIAGNOSIS — H52223 Regular astigmatism, bilateral: Secondary | ICD-10-CM | POA: Diagnosis not present

## 2016-04-24 DIAGNOSIS — H5203 Hypermetropia, bilateral: Secondary | ICD-10-CM | POA: Diagnosis not present

## 2016-04-28 DIAGNOSIS — M65322 Trigger finger, left index finger: Secondary | ICD-10-CM | POA: Diagnosis not present

## 2016-06-09 DIAGNOSIS — M65322 Trigger finger, left index finger: Secondary | ICD-10-CM | POA: Diagnosis not present

## 2016-09-03 DIAGNOSIS — H21553 Recession of chamber angle, bilateral: Secondary | ICD-10-CM | POA: Diagnosis not present

## 2016-09-03 DIAGNOSIS — H40033 Anatomical narrow angle, bilateral: Secondary | ICD-10-CM | POA: Diagnosis not present

## 2016-11-17 DIAGNOSIS — Z23 Encounter for immunization: Secondary | ICD-10-CM | POA: Diagnosis not present

## 2017-01-18 DIAGNOSIS — H8112 Benign paroxysmal vertigo, left ear: Secondary | ICD-10-CM | POA: Diagnosis not present

## 2017-01-18 DIAGNOSIS — H8111 Benign paroxysmal vertigo, right ear: Secondary | ICD-10-CM | POA: Diagnosis not present

## 2017-01-18 DIAGNOSIS — H9113 Presbycusis, bilateral: Secondary | ICD-10-CM | POA: Diagnosis not present

## 2017-02-09 DIAGNOSIS — H903 Sensorineural hearing loss, bilateral: Secondary | ICD-10-CM | POA: Diagnosis not present

## 2017-03-22 DIAGNOSIS — G4733 Obstructive sleep apnea (adult) (pediatric): Secondary | ICD-10-CM | POA: Diagnosis not present

## 2017-04-29 DIAGNOSIS — H52223 Regular astigmatism, bilateral: Secondary | ICD-10-CM | POA: Diagnosis not present

## 2017-04-29 DIAGNOSIS — H5203 Hypermetropia, bilateral: Secondary | ICD-10-CM | POA: Diagnosis not present

## 2017-04-29 DIAGNOSIS — H40013 Open angle with borderline findings, low risk, bilateral: Secondary | ICD-10-CM | POA: Diagnosis not present

## 2017-04-29 DIAGNOSIS — H40033 Anatomical narrow angle, bilateral: Secondary | ICD-10-CM | POA: Diagnosis not present

## 2017-04-29 DIAGNOSIS — H25813 Combined forms of age-related cataract, bilateral: Secondary | ICD-10-CM | POA: Diagnosis not present

## 2017-05-13 DIAGNOSIS — Z125 Encounter for screening for malignant neoplasm of prostate: Secondary | ICD-10-CM | POA: Diagnosis not present

## 2017-05-13 DIAGNOSIS — E785 Hyperlipidemia, unspecified: Secondary | ICD-10-CM | POA: Diagnosis not present

## 2017-05-13 DIAGNOSIS — Z8739 Personal history of other diseases of the musculoskeletal system and connective tissue: Secondary | ICD-10-CM | POA: Diagnosis not present

## 2017-05-13 DIAGNOSIS — I10 Essential (primary) hypertension: Secondary | ICD-10-CM | POA: Diagnosis not present

## 2017-05-20 DIAGNOSIS — R251 Tremor, unspecified: Secondary | ICD-10-CM | POA: Diagnosis not present

## 2017-05-20 DIAGNOSIS — R8290 Unspecified abnormal findings in urine: Secondary | ICD-10-CM | POA: Diagnosis not present

## 2017-05-20 DIAGNOSIS — R7301 Impaired fasting glucose: Secondary | ICD-10-CM | POA: Diagnosis not present

## 2017-05-20 DIAGNOSIS — I10 Essential (primary) hypertension: Secondary | ICD-10-CM | POA: Diagnosis not present

## 2017-05-20 DIAGNOSIS — F338 Other recurrent depressive disorders: Secondary | ICD-10-CM | POA: Diagnosis not present

## 2017-05-20 DIAGNOSIS — Z8739 Personal history of other diseases of the musculoskeletal system and connective tissue: Secondary | ICD-10-CM | POA: Diagnosis not present

## 2017-05-20 DIAGNOSIS — G4730 Sleep apnea, unspecified: Secondary | ICD-10-CM | POA: Diagnosis not present

## 2017-05-20 DIAGNOSIS — Z0000 Encounter for general adult medical examination without abnormal findings: Secondary | ICD-10-CM | POA: Diagnosis not present

## 2017-05-20 DIAGNOSIS — N529 Male erectile dysfunction, unspecified: Secondary | ICD-10-CM | POA: Diagnosis not present

## 2017-11-04 DIAGNOSIS — L603 Nail dystrophy: Secondary | ICD-10-CM | POA: Diagnosis not present

## 2017-11-04 DIAGNOSIS — S90221A Contusion of right lesser toe(s) with damage to nail, initial encounter: Secondary | ICD-10-CM | POA: Diagnosis not present

## 2017-12-31 DIAGNOSIS — Z23 Encounter for immunization: Secondary | ICD-10-CM | POA: Diagnosis not present

## 2018-01-03 DIAGNOSIS — S60222A Contusion of left hand, initial encounter: Secondary | ICD-10-CM | POA: Diagnosis not present

## 2018-03-19 DIAGNOSIS — J029 Acute pharyngitis, unspecified: Secondary | ICD-10-CM | POA: Diagnosis not present

## 2018-03-21 DIAGNOSIS — G4733 Obstructive sleep apnea (adult) (pediatric): Secondary | ICD-10-CM | POA: Diagnosis not present

## 2018-04-20 DIAGNOSIS — G4733 Obstructive sleep apnea (adult) (pediatric): Secondary | ICD-10-CM | POA: Diagnosis not present

## 2018-04-20 DIAGNOSIS — R400 Somnolence: Secondary | ICD-10-CM | POA: Diagnosis not present

## 2018-04-20 DIAGNOSIS — R269 Unspecified abnormalities of gait and mobility: Secondary | ICD-10-CM | POA: Diagnosis not present

## 2018-05-30 DIAGNOSIS — Z1159 Encounter for screening for other viral diseases: Secondary | ICD-10-CM | POA: Diagnosis not present

## 2018-05-30 DIAGNOSIS — R7301 Impaired fasting glucose: Secondary | ICD-10-CM | POA: Diagnosis not present

## 2018-05-30 DIAGNOSIS — Z125 Encounter for screening for malignant neoplasm of prostate: Secondary | ICD-10-CM | POA: Diagnosis not present

## 2018-05-30 DIAGNOSIS — I10 Essential (primary) hypertension: Secondary | ICD-10-CM | POA: Diagnosis not present

## 2018-06-02 DIAGNOSIS — K219 Gastro-esophageal reflux disease without esophagitis: Secondary | ICD-10-CM | POA: Diagnosis not present

## 2018-06-02 DIAGNOSIS — Z8739 Personal history of other diseases of the musculoskeletal system and connective tissue: Secondary | ICD-10-CM | POA: Diagnosis not present

## 2018-06-02 DIAGNOSIS — R7301 Impaired fasting glucose: Secondary | ICD-10-CM | POA: Diagnosis not present

## 2018-06-02 DIAGNOSIS — Z0000 Encounter for general adult medical examination without abnormal findings: Secondary | ICD-10-CM | POA: Diagnosis not present

## 2018-06-02 DIAGNOSIS — N410 Acute prostatitis: Secondary | ICD-10-CM | POA: Diagnosis not present

## 2018-06-02 DIAGNOSIS — Z1159 Encounter for screening for other viral diseases: Secondary | ICD-10-CM | POA: Diagnosis not present

## 2018-06-02 DIAGNOSIS — G4733 Obstructive sleep apnea (adult) (pediatric): Secondary | ICD-10-CM | POA: Diagnosis not present

## 2018-06-02 DIAGNOSIS — I10 Essential (primary) hypertension: Secondary | ICD-10-CM | POA: Diagnosis not present

## 2018-06-02 DIAGNOSIS — R69 Illness, unspecified: Secondary | ICD-10-CM | POA: Diagnosis not present

## 2018-06-02 DIAGNOSIS — E785 Hyperlipidemia, unspecified: Secondary | ICD-10-CM | POA: Diagnosis not present

## 2018-06-06 DIAGNOSIS — M5412 Radiculopathy, cervical region: Secondary | ICD-10-CM | POA: Diagnosis not present

## 2018-06-06 DIAGNOSIS — R21 Rash and other nonspecific skin eruption: Secondary | ICD-10-CM | POA: Diagnosis not present

## 2018-09-01 DIAGNOSIS — G4730 Sleep apnea, unspecified: Secondary | ICD-10-CM | POA: Diagnosis not present

## 2018-09-01 DIAGNOSIS — M1990 Unspecified osteoarthritis, unspecified site: Secondary | ICD-10-CM | POA: Diagnosis not present

## 2018-09-01 DIAGNOSIS — N529 Male erectile dysfunction, unspecified: Secondary | ICD-10-CM | POA: Diagnosis not present

## 2018-09-01 DIAGNOSIS — J309 Allergic rhinitis, unspecified: Secondary | ICD-10-CM | POA: Diagnosis not present

## 2018-09-01 DIAGNOSIS — K219 Gastro-esophageal reflux disease without esophagitis: Secondary | ICD-10-CM | POA: Diagnosis not present

## 2018-09-01 DIAGNOSIS — M109 Gout, unspecified: Secondary | ICD-10-CM | POA: Diagnosis not present

## 2018-09-01 DIAGNOSIS — G43909 Migraine, unspecified, not intractable, without status migrainosus: Secondary | ICD-10-CM | POA: Diagnosis not present

## 2018-09-01 DIAGNOSIS — R69 Illness, unspecified: Secondary | ICD-10-CM | POA: Diagnosis not present

## 2018-09-01 DIAGNOSIS — G8929 Other chronic pain: Secondary | ICD-10-CM | POA: Diagnosis not present

## 2018-09-09 DIAGNOSIS — R3989 Other symptoms and signs involving the genitourinary system: Secondary | ICD-10-CM | POA: Diagnosis not present

## 2018-11-30 DIAGNOSIS — G4733 Obstructive sleep apnea (adult) (pediatric): Secondary | ICD-10-CM | POA: Diagnosis not present

## 2018-12-02 DIAGNOSIS — K219 Gastro-esophageal reflux disease without esophagitis: Secondary | ICD-10-CM | POA: Diagnosis not present

## 2018-12-02 DIAGNOSIS — Z0000 Encounter for general adult medical examination without abnormal findings: Secondary | ICD-10-CM | POA: Diagnosis not present

## 2018-12-02 DIAGNOSIS — R3989 Other symptoms and signs involving the genitourinary system: Secondary | ICD-10-CM | POA: Diagnosis not present

## 2018-12-02 DIAGNOSIS — R7301 Impaired fasting glucose: Secondary | ICD-10-CM | POA: Diagnosis not present

## 2018-12-02 DIAGNOSIS — E785 Hyperlipidemia, unspecified: Secondary | ICD-10-CM | POA: Diagnosis not present

## 2018-12-02 DIAGNOSIS — Z8739 Personal history of other diseases of the musculoskeletal system and connective tissue: Secondary | ICD-10-CM | POA: Diagnosis not present

## 2018-12-02 DIAGNOSIS — R69 Illness, unspecified: Secondary | ICD-10-CM | POA: Diagnosis not present

## 2018-12-02 DIAGNOSIS — G4733 Obstructive sleep apnea (adult) (pediatric): Secondary | ICD-10-CM | POA: Diagnosis not present

## 2018-12-02 DIAGNOSIS — I10 Essential (primary) hypertension: Secondary | ICD-10-CM | POA: Diagnosis not present

## 2018-12-02 DIAGNOSIS — Z1159 Encounter for screening for other viral diseases: Secondary | ICD-10-CM | POA: Diagnosis not present

## 2018-12-13 DIAGNOSIS — R69 Illness, unspecified: Secondary | ICD-10-CM | POA: Diagnosis not present

## 2018-12-15 ENCOUNTER — Other Ambulatory Visit: Payer: Self-pay | Admitting: Emergency Medicine

## 2018-12-15 DIAGNOSIS — Z20822 Contact with and (suspected) exposure to covid-19: Secondary | ICD-10-CM

## 2018-12-15 DIAGNOSIS — Z20828 Contact with and (suspected) exposure to other viral communicable diseases: Secondary | ICD-10-CM | POA: Diagnosis not present

## 2018-12-16 LAB — NOVEL CORONAVIRUS, NAA: SARS-CoV-2, NAA: NOT DETECTED

## 2018-12-30 DIAGNOSIS — G4733 Obstructive sleep apnea (adult) (pediatric): Secondary | ICD-10-CM | POA: Diagnosis not present

## 2019-01-30 DIAGNOSIS — G4733 Obstructive sleep apnea (adult) (pediatric): Secondary | ICD-10-CM | POA: Diagnosis not present

## 2019-03-24 DIAGNOSIS — M25551 Pain in right hip: Secondary | ICD-10-CM | POA: Diagnosis not present

## 2019-03-24 DIAGNOSIS — M1611 Unilateral primary osteoarthritis, right hip: Secondary | ICD-10-CM | POA: Diagnosis not present

## 2019-03-27 DIAGNOSIS — G4733 Obstructive sleep apnea (adult) (pediatric): Secondary | ICD-10-CM | POA: Diagnosis not present

## 2019-04-09 ENCOUNTER — Ambulatory Visit: Payer: Medicare HMO | Attending: Internal Medicine

## 2019-04-09 ENCOUNTER — Ambulatory Visit: Payer: Medicare HMO

## 2019-04-09 DIAGNOSIS — Z23 Encounter for immunization: Secondary | ICD-10-CM | POA: Insufficient documentation

## 2019-04-09 NOTE — Progress Notes (Signed)
   Covid-19 Vaccination Clinic  Name:  TRAMPIS MELHORN    MRN: [id number] DOB: 28-May-1945  04/09/2019  Mr. Macnaughton was observed post Covid-19 immunization for 15 minutes without incidence. He was provided with Vaccine Information Sheet and instruction to access the V-Safe system.   Mr. Cruse was instructed to call 911 with any severe reactions post vaccine: Marland Kitchen Difficulty breathing  . Swelling of your face and throat  . A fast heartbeat  . A bad rash all over your body  . Dizziness and weakness    Immunizations Administered    Name Date Dose VIS Date Route   Pfizer COVID-19 Vaccine 04/09/2019 10:13 AM 0.3 mL 02/24/2019 Intramuscular   Manufacturer: [place]   Lot: [id number]   [place]: [id number]

## 2019-04-10 DIAGNOSIS — E785 Hyperlipidemia, unspecified: Secondary | ICD-10-CM | POA: Diagnosis not present

## 2019-04-10 DIAGNOSIS — I10 Essential (primary) hypertension: Secondary | ICD-10-CM | POA: Diagnosis not present

## 2019-04-10 DIAGNOSIS — R69 Illness, unspecified: Secondary | ICD-10-CM | POA: Diagnosis not present

## 2019-04-19 DIAGNOSIS — R69 Illness, unspecified: Secondary | ICD-10-CM | POA: Diagnosis not present

## 2019-04-19 DIAGNOSIS — I10 Essential (primary) hypertension: Secondary | ICD-10-CM | POA: Diagnosis not present

## 2019-04-19 DIAGNOSIS — E785 Hyperlipidemia, unspecified: Secondary | ICD-10-CM | POA: Diagnosis not present

## 2019-04-25 ENCOUNTER — Ambulatory Visit: Payer: Medicare HMO

## 2019-05-01 ENCOUNTER — Ambulatory Visit: Payer: Medicare HMO | Attending: Internal Medicine

## 2019-05-01 DIAGNOSIS — Z23 Encounter for immunization: Secondary | ICD-10-CM | POA: Insufficient documentation

## 2019-05-01 NOTE — Progress Notes (Signed)
   Covid-19 Vaccination Clinic  Name:  Craig Rice    MRN: [id number] DOB: 06-28-1945  05/01/2019  [name] was observed post Covid-19 immunization for 15 minutes without incidence. He was provided with Vaccine Information Sheet and instruction to access the V-Safe system.   [name] was instructed to call 911 with any severe reactions post vaccine: Marland Kitchen Difficulty breathing  . Swelling of your face and throat  . A fast heartbeat  . A bad rash all over your body  . Dizziness and weakness    Immunizations Administered    Name Date Dose VIS Date Route   Pfizer COVID-19 Vaccine 05/01/2019  8:19 AM 0.3 mL 02/24/2019 Intramuscular   Manufacturer: [place]   Lot: [id number]   NDC: [place] COVID-19 Vaccine 05/01/2019  8:25 AM 0.3 mL 02/24/2019 Intramuscular   Manufacturer: [place]   Lot: [id number]   [place]: [id number]

## 2019-05-09 DIAGNOSIS — Z01818 Encounter for other preprocedural examination: Secondary | ICD-10-CM | POA: Diagnosis not present

## 2019-05-09 DIAGNOSIS — Z8739 Personal history of other diseases of the musculoskeletal system and connective tissue: Secondary | ICD-10-CM | POA: Diagnosis not present

## 2019-05-09 DIAGNOSIS — Z125 Encounter for screening for malignant neoplasm of prostate: Secondary | ICD-10-CM | POA: Diagnosis not present

## 2019-05-09 DIAGNOSIS — I10 Essential (primary) hypertension: Secondary | ICD-10-CM | POA: Diagnosis not present

## 2019-05-09 DIAGNOSIS — R7301 Impaired fasting glucose: Secondary | ICD-10-CM | POA: Diagnosis not present

## 2019-05-12 DIAGNOSIS — R7301 Impaired fasting glucose: Secondary | ICD-10-CM | POA: Diagnosis not present

## 2019-05-12 DIAGNOSIS — M25559 Pain in unspecified hip: Secondary | ICD-10-CM | POA: Diagnosis not present

## 2019-05-12 DIAGNOSIS — Z01818 Encounter for other preprocedural examination: Secondary | ICD-10-CM | POA: Diagnosis not present

## 2019-05-12 DIAGNOSIS — Z79899 Other long term (current) drug therapy: Secondary | ICD-10-CM | POA: Diagnosis not present

## 2019-05-30 DIAGNOSIS — R69 Illness, unspecified: Secondary | ICD-10-CM | POA: Diagnosis not present

## 2019-06-08 DIAGNOSIS — Z0000 Encounter for general adult medical examination without abnormal findings: Secondary | ICD-10-CM | POA: Diagnosis not present

## 2019-06-08 DIAGNOSIS — R7301 Impaired fasting glucose: Secondary | ICD-10-CM | POA: Diagnosis not present

## 2019-06-08 DIAGNOSIS — Z8739 Personal history of other diseases of the musculoskeletal system and connective tissue: Secondary | ICD-10-CM | POA: Diagnosis not present

## 2019-06-08 DIAGNOSIS — R8290 Unspecified abnormal findings in urine: Secondary | ICD-10-CM | POA: Diagnosis not present

## 2019-06-08 DIAGNOSIS — I10 Essential (primary) hypertension: Secondary | ICD-10-CM | POA: Diagnosis not present

## 2019-06-08 DIAGNOSIS — E785 Hyperlipidemia, unspecified: Secondary | ICD-10-CM | POA: Diagnosis not present

## 2019-06-08 DIAGNOSIS — G4730 Sleep apnea, unspecified: Secondary | ICD-10-CM | POA: Diagnosis not present

## 2019-06-08 DIAGNOSIS — G2581 Restless legs syndrome: Secondary | ICD-10-CM | POA: Diagnosis not present

## 2019-06-08 DIAGNOSIS — R69 Illness, unspecified: Secondary | ICD-10-CM | POA: Diagnosis not present

## 2019-06-08 DIAGNOSIS — Z6836 Body mass index (BMI) 36.0-36.9, adult: Secondary | ICD-10-CM | POA: Diagnosis not present

## 2019-06-20 DIAGNOSIS — E785 Hyperlipidemia, unspecified: Secondary | ICD-10-CM | POA: Diagnosis not present

## 2019-06-20 DIAGNOSIS — R69 Illness, unspecified: Secondary | ICD-10-CM | POA: Diagnosis not present

## 2019-06-20 DIAGNOSIS — I10 Essential (primary) hypertension: Secondary | ICD-10-CM | POA: Diagnosis not present

## 2019-06-27 NOTE — Patient Instructions (Addendum)
DUE TO COVID-19 ONLY ONE VISITOR IS ALLOWED TO COME WITH YOU AND STAY IN THE WAITING ROOM ONLY DURING PRE OP AND PROCEDURE DAY OF SURGERY. THE 1 VISITOR MAY VISIT WITH YOU AFTER SURGERY IN YOUR PRIVATE ROOM DURING VISITING HOURS ONLY!  YOU NEED TO HAVE A COVID 19 TEST ON: 07/01/19 @ 10:30 am , THIS TEST MUST BE DONE BEFORE SURGERY, COME  [place], [place] , [id number].  ([place]) ONCE YOUR COVID TEST IS COMPLETED, PLEASE BEGIN THE QUARANTINE INSTRUCTIONS AS OUTLINED IN YOUR HANDOUT.                Craig Rice    Your procedure is scheduled on: 07/05/19   Report to [facility] Main  Entrance   Report to admitting at : 6:00 AM     Call this number if you have problems the morning of surgery [phone]    Remember:    [place], NO [place].     Take these medicines the morning of surgery with A SIP OF WATER: allopurinol,cetirizine,metoprolol,omeprazole,paroxetine.                                 You may not have any metal on your body including hair pins and              piercings  Do not wear jewelry, lotions, powders or perfumes, deodorant             Men may shave face and neck.   Do not bring valuables to the hospital. [place].  Contacts, dentures or bridgework may not be worn into surgery.  Leave suitcase in the car. After surgery it may be brought to your room.     Patients discharged the day of surgery will not be allowed to drive home. IF YOU ARE HAVING SURGERY AND GOING HOME THE SAME DAY, YOU MUST HAVE AN ADULT TO DRIVE YOU HOME AND BE WITH YOU FOR 24 HOURS. YOU MAY GO HOME BY TAXI OR UBER OR ORTHERWISE, BUT AN ADULT MUST ACCOMPANY YOU HOME AND STAY WITH YOU FOR 24 HOURS.  Name and phone number of your driver:  Special Instructions: N/A              Please read over the following fact sheets you were  given: _____________________________________________________________________             NO SOLID FOOD AFTER MIDNIGHT THE NIGHT PRIOR TO SURGERY. NOTHING BY MOUTH EXCEPT CLEAR LIQUIDS UNTIL: 5:30 am . PLEASE FINISH ENSURE DRINK PER SURGEON ORDER  WHICH NEEDS TO BE COMPLETED AT: 5:30 am .   CLEAR LIQUID DIET   Foods Allowed                                                                     Foods Excluded  Coffee and tea, regular and decaf  liquids that you cannot  Plain Jell-O any favor except red or purple                                           see through such as: Fruit ices (not with fruit pulp)                                     milk, soups, orange juice  Iced Popsicles                                    All solid food Carbonated beverages, regular and diet                                    Cranberry, grape and apple juices Sports drinks like Gatorade Lightly seasoned clear broth or consume(fat free) Sugar, honey syrup  Sample Menu Breakfast                                Lunch                                     Supper Cranberry juice                    Beef broth                            Chicken broth Jell-O                                     Grape juice                           Apple juice Coffee or tea                        Jell-O                                      Popsicle                                                Coffee or tea                        Coffee or tea  _____________________________________________________________________  [facility] - Preparing for Surgery Before surgery, you can play an important role.  Because skin is not sterile, your skin needs to be as free of germs as possible.  You can reduce the number of germs on your skin by washing with CHG (chlorahexidine gluconate) soap before surgery.  CHG is an antiseptic cleaner which kills  germs and bonds with the skin to continue killing germs even after  washing. Please DO NOT use if you have an allergy to CHG or antibacterial soaps.  If your skin becomes reddened/irritated stop using the CHG and inform your nurse when you arrive at Short Stay. Do not shave (including legs and underarms) for at least 48 hours prior to the first CHG shower.  You may shave your face/neck. Please follow these instructions carefully:  1.  Shower with CHG Soap the night before surgery and the  morning of Surgery.  2.  If you choose to wash your hair, wash your hair first as usual with your  normal  shampoo.  3.  After you shampoo, rinse your hair and body thoroughly to remove the  shampoo.                           4.  Use CHG as you would any other liquid soap.  You can apply chg directly  to the skin and wash                       Gently with a scrungie or clean washcloth.  5.  Apply the CHG Soap to your body ONLY FROM THE NECK DOWN.   Do not use on face/ open                           Wound or open sores. Avoid contact with eyes, ears mouth and genitals (private parts).                       Wash face,  Genitals (private parts) with your normal soap.             6.  Wash thoroughly, paying special attention to the area where your surgery  will be performed.  7.  Thoroughly rinse your body with warm water from the neck down.  8.  DO NOT shower/wash with your normal soap after using and rinsing off  the CHG Soap.                9.  Pat yourself dry with a clean towel.            10.  Wear clean pajamas.            11.  Place clean sheets on your bed the night of your first shower and do not  sleep with pets. Day of Surgery : Do not apply any lotions/deodorants the morning of surgery.  Please wear clean clothes to the hospital/surgery center.  FAILURE TO FOLLOW THESE INSTRUCTIONS MAY RESULT IN THE CANCELLATION OF YOUR SURGERY PATIENT SIGNATURE_________________________________  NURSE  SIGNATURE__________________________________  ________________________________________________________________________   Craig Rice  An incentive spirometer is a tool that can help keep your lungs clear and active. This tool measures how well you are filling your lungs with each breath. Taking long deep breaths may help reverse or decrease the chance of developing breathing (pulmonary) problems (especially infection) following:  A long period of time when you are unable to move or be active. BEFORE THE PROCEDURE   If the spirometer includes an indicator to show your best effort, your nurse or respiratory therapist will set it to a desired goal.  If possible, sit up straight or lean slightly forward. Try not to slouch.  Hold the incentive spirometer in an  upright position. INSTRUCTIONS FOR USE  1. Sit on the edge of your bed if possible, or sit up as far as you can in bed or on a chair. 2. Hold the incentive spirometer in an upright position. 3. Breathe out normally. 4. Place the mouthpiece in your mouth and seal your lips tightly around it. 5. Breathe in slowly and as deeply as possible, raising the piston or the ball toward the top of the column. 6. Hold your breath for 3-5 seconds or for as long as possible. Allow the piston or ball to fall to the bottom of the column. 7. Remove the mouthpiece from your mouth and breathe out normally. 8. Rest for a few seconds and repeat Steps 1 through 7 at least 10 times every 1-2 hours when you are awake. Take your time and take a few normal breaths between deep breaths. 9. The spirometer may include an indicator to show your best effort. Use the indicator as a goal to work toward during each repetition. 10. After each set of 10 deep breaths, practice coughing to be sure your lungs are clear. If you have an incision (the cut made at the time of surgery), support your incision when coughing by placing a pillow or rolled up towels firmly  against it. Once you are able to get out of bed, walk around indoors and cough well. You may stop using the incentive spirometer when instructed by your caregiver.  RISKS AND COMPLICATIONS  Take your time so you do not get dizzy or light-headed.  If you are in pain, you may need to take or ask for pain medication before doing incentive spirometry. It is harder to take a deep breath if you are having pain. AFTER USE  Rest and breathe slowly and easily.  It can be helpful to keep track of a log of your progress. Your caregiver can provide you with a simple table to help with this. If you are using the spirometer at home, follow these instructions: [place] IF:   You are having difficultly using the spirometer.  You have trouble using the spirometer as often as instructed.  Your pain medication is not giving enough relief while using the spirometer.  You develop fever of 100.5 F (38.1 C) or higher. SEEK IMMEDIATE MEDICAL CARE IF:   You cough up bloody sputum that had not been present before.  You develop fever of 102 F (38.9 C) or greater.  You develop worsening pain at or near the incision site. MAKE SURE YOU:   Understand these instructions.  Will watch your condition.  Will get help right away if you are not doing well or get worse. Document Released: 07/13/2006 Document Revised: 05/25/2011 Document Reviewed: 09/13/2006 ExitCare Patient Information 2014 Exit[employer], [place].   ________________________________________________________________________  WHAT IS A BLOOD TRANSFUSION? Blood Transfusion Information  A transfusion is the replacement of blood or some of its parts. Blood is made up of multiple cells which provide different functions.  Red blood cells carry oxygen and are used for blood loss replacement.  White blood cells fight against infection.  Platelets control bleeding.  Plasma helps clot blood.  Other blood products are available for  specialized needs, such as hemophilia or other clotting disorders. BEFORE THE TRANSFUSION  Who gives blood for transfusions?   Healthy volunteers who are fully evaluated to make sure their blood is safe. This is blood bank blood. Transfusion therapy is the safest it has ever been in the practice of medicine.  Before blood is taken from a donor, a complete history is taken to make sure that person has no history of diseases nor engages in risky social behavior (examples are intravenous drug use or sexual activity with multiple partners). The donor's travel history is screened to minimize risk of transmitting infections, such as malaria. The donated blood is tested for signs of infectious diseases, such as HIV and hepatitis. The blood is then tested to be sure it is compatible with you in order to minimize the chance of a transfusion reaction. If you or a relative donates blood, this is often done in anticipation of surgery and is not appropriate for emergency situations. It takes many days to process the donated blood. RISKS AND COMPLICATIONS Although transfusion therapy is very safe and saves many lives, the main dangers of transfusion include:   Getting an infectious disease.  Developing a transfusion reaction. This is an allergic reaction to something in the blood you were given. Every precaution is taken to prevent this. The decision to have a blood transfusion has been considered carefully by your caregiver before blood is given. Blood is not given unless the benefits outweigh the risks. AFTER THE TRANSFUSION  Right after receiving a blood transfusion, you will usually feel much better and more energetic. This is especially true if your red blood cells have gotten low (anemic). The transfusion raises the level of the red blood cells which carry oxygen, and this usually causes an energy increase.  The nurse administering the transfusion will monitor you carefully for complications. HOME CARE  INSTRUCTIONS  No special instructions are needed after a transfusion. You may find your energy is better. Speak with your caregiver about any limitations on activity for underlying diseases you may have. SEEK MEDICAL CARE IF:   Your condition is not improving after your transfusion.  You develop redness or irritation at the intravenous (IV) site. SEEK IMMEDIATE MEDICAL CARE IF:  Any of the following symptoms occur over the next 12 hours:  Shaking chills.  You have a temperature by mouth above 102 F (38.9 C), not controlled by medicine.  Chest, back, or muscle pain.  People around you feel you are not acting correctly or are confused.  Shortness of breath or difficulty breathing.  Dizziness and fainting.  You get a rash or develop hives.  You have a decrease in urine output.  Your urine turns a dark color or changes to pink, red, or brown. Any of the following symptoms occur over the next 10 days:  You have a temperature by mouth above 102 F (38.9 C), not controlled by medicine.  Shortness of breath.  Weakness after normal activity.  The white part of the eye turns yellow (jaundice).  You have a decrease in the amount of urine or are urinating less often.  Your urine turns a dark color or changes to pink, red, or brown. Document Released: 02/28/2000 Document Revised: 05/25/2011 Document Reviewed: 10/17/2007 [facility] Patient Information 2014 [place], [place].  _______________________________________________________________________

## 2019-06-28 ENCOUNTER — Encounter (HOSPITAL_COMMUNITY): Payer: Self-pay

## 2019-06-28 ENCOUNTER — Other Ambulatory Visit: Payer: Self-pay

## 2019-06-28 ENCOUNTER — Encounter (HOSPITAL_COMMUNITY)
Admission: RE | Admit: 2019-06-28 | Discharge: 2019-06-28 | Disposition: A | Discharge status: Home / Self Care | Payer: Medicare HMO | Source: Ambulatory Visit | Attending: Orthopedic Surgery | Admitting: Orthopedic Surgery

## 2019-06-28 DIAGNOSIS — Z01812 Encounter for preprocedural laboratory examination: Secondary | ICD-10-CM | POA: Diagnosis not present

## 2019-06-28 HISTORY — DX: Prediabetes: R73.03

## 2019-06-28 NOTE — H&P (Signed)
TOTAL HIP ADMISSION H&P  Patient is admitted for right total hip arthroplasty.  Subjective:  Chief Complaint: Right hip pain  HPI: Craig Rice, 74 y.o. male, has a history of pain and functional disability in the right hip due to arthritis and patient has failed non-surgical conservative treatments for greater than 12 weeks to include NSAID's and/or analgesics and activity modification. Onset of symptoms was abrupt, starting 1 years ago with gradually worsening course since that time. The patient noted no past surgery on the right hip. Patient currently rates pain in the right hip at 8 out of 10 with activity. Patient has worsening of pain with activity and weight bearing, pain that interfers with activities of daily living and crepitus. Patient has evidence of significant joint-space narrowing superolaterally. He is essentially bone-on-bone centrally and inferiorly. There are marginal osteophytes and subchondral cysts by imaging studies. This condition presents safety issues increasing the risk of falls. There is no current active infection.  Patient Active Problem List   Diagnosis Date Noted  . Postop Hyponatremia 05/25/2012  . Postoperative anemia due to acute blood loss 05/24/2012  . OA (osteoarthritis) of knee 05/23/2012    Past Medical History:  Diagnosis Date  . Arthritis    osteoarthritis -knee  . Depression   . GERD (gastroesophageal reflux disease)   . H/O hiatal hernia   . Headache(784.0)    tx. migraines-"Metoprolol"  . Pre-diabetes    manage with diet.    Past Surgical History:  Procedure Laterality Date  . BACK SURGERY     lumbar laminectomy  . KNEE ARTHROSCOPY     bil. knee scopes  . MASS EXCISION     chest"benign"  . TOTAL KNEE ARTHROPLASTY Right 05/23/2012   Procedure: TOTAL KNEE ARTHROPLASTY;  Surgeon: Gearlean Alf, MD;  Location: [facility];  Service: Orthopedics;  Laterality: Right;  Marland Kitchen VASECTOMY      Prior to Admission medications   Medication Sig Start  Date End Date Taking? Authorizing Provider  acetaminophen (TYLENOL) 650 MG CR tablet Take 1,300 mg by mouth in the morning and at bedtime.   Yes [provider]  allopurinol (ZYLOPRIM) 100 MG tablet Take 200 mg by mouth daily.   Yes [provider]  cetirizine (ZYRTEC) 10 MG tablet Take 5-10 mg by mouth daily as needed for allergies.   Yes [provider]  metoprolol (LOPRESSOR) 50 MG tablet Take 50 mg by mouth daily before breakfast.   Yes [provider]  omeprazole (PRILOSEC OTC) 20 MG tablet Take 10-20 mg by mouth daily as needed (acid reflux).    Yes [provider]  PARoxetine (PAXIL) 20 MG tablet Take 20 mg by mouth daily before breakfast.   Yes [provider]    Allergies  Allergen Reactions  . Morphine And Related Itching    Social History   Socioeconomic History  . Marital status: Married    Spouse name: Not on file  . Number of children: Not on file  . Years of education: Not on file  . Highest education level: Not on file  Occupational History  . Not on file  Tobacco Use  . Smoking status: Former Smoker    Types: Cigarettes    Quit date: 05/16/1980    Years since quitting: 39.1  . Smokeless tobacco: Never Used  Substance and Sexual Activity  . Alcohol use: Yes    Alcohol/week: 5.0 standard drinks    Types: 5 Shots of liquor per week  Comment: x 5 drinks per week  . Drug use: No  . Sexual activity: Yes  Other Topics Concern  . Not on file  Social History Narrative  . Not on file   Social Determinants of Health   Financial Resource Strain:   . Difficulty of Paying Living Expenses:   Food Insecurity:   . Worried About Charity fundraiser in the Last Year:   . Arboriculturist in the Last Year:   Transportation Needs:   . Film/video editor (Medical):   Marland Kitchen Lack of Transportation (Non-Medical):   Physical Activity:   . Days of Exercise per Week:   . Minutes of Exercise per Session:   Stress:   .  Feeling of Stress :   Social Connections:   . Frequency of Communication with Friends and Family:   . Frequency of Social Gatherings with Friends and Family:   . Attends Religious Services:   . Active Member of Clubs or Organizations:   . Attends Archivist Meetings:   Marland Kitchen Marital Status:   Intimate Partner Violence:   . Fear of Current or Ex-Partner:   . Emotionally Abused:   Marland Kitchen Physically Abused:   . Sexually Abused:       Tobacco Use: Medium Risk  . Smoking Tobacco Use: Former Smoker  . Smokeless Tobacco Use: Never Used   Social History   Substance and Sexual Activity  Alcohol Use Yes  . Alcohol/week: 5.0 standard drinks  . Types: 5 Shots of liquor per week   Comment: x 5 drinks per week    Family History  Problem Relation Age of Onset  . Aortic aneurysm Father     Review of Systems  Constitutional: Negative for chills and fever.  HENT: Negative for congestion, sore throat and tinnitus.   Eyes: Negative for double vision, photophobia and pain.  Respiratory: Negative for cough, shortness of breath and wheezing.   Cardiovascular: Negative for chest pain, palpitations and orthopnea.  Gastrointestinal: Negative for heartburn, nausea and vomiting.  Genitourinary: Negative for dysuria, frequency and urgency.  Musculoskeletal: Positive for joint pain.  Neurological: Negative for dizziness, weakness and headaches.      Objective:  Physical Exam: Well nourished and well developed.  General: Alert and oriented x3, cooperative and pleasant, no acute distress.  Head: normocephalic, atraumatic, neck supple.  Eyes: EOMI.  Respiratory: breath sounds clear in all fields, no wheezing, rales, or rhonchi. Cardiovascular: Regular rate and rhythm, no murmurs, gallops or rubs.  Abdomen: non-tender to palpation and soft, normoactive bowel sounds. Musculoskeletal:  Right Hip Exam: ROM: Flexion to 100, Internal Rotation 0, External Rotation 20 to 30, and abduction  30. There is no tenderness over the greater trochanter bursa.  Calves soft and nontender. Motor function intact in LE. Strength 5/5 LE bilaterally. Neuro: Distal pulses 2+. Sensation to light touch intact in LE.  Vital signs in last 24 hours: Weight:  [117.9 kg] 117.9 kg (04/14 0906)  Imaging Review Plain radiographs demonstrate severe degenerative joint disease of the right hip. The bone quality appears to be adequate for age and reported activity level.  Assessment/Plan:  End stage arthritis, right hip  The patient history, physical examination, clinical judgement of the provider and imaging studies are consistent with end stage degenerative joint disease of the right hip and total hip arthroplasty is deemed medically necessary. The treatment options including medical management, injection therapy, arthroscopy and arthroplasty were discussed at length. The risks and benefits of total hip  arthroplasty were presented and reviewed. The risks due to aseptic loosening, infection, stiffness, dislocation/subluxation, thromboembolic complications and other imponderables were discussed. The patient acknowledged the explanation, agreed to proceed with the plan and consent was signed. Patient is being admitted for inpatient treatment for surgery, pain control, PT, OT, prophylactic antibiotics, VTE prophylaxis, progressive ambulation and ADLs and discharge planning.The patient is planning to be discharged home.   Patient's anticipated LOS is less than 2 midnights, meeting these requirements: - Younger than 76 - Lives within 1 hour of care - Has a competent adult at home to recover with post-op recover - NO history of  - Chronic pain requiring opiods  - Diabetes  - Coronary Artery Disease  - Heart failure  - Heart attack  - Stroke  - DVT/VTE  - Cardiac arrhythmia  - Respiratory Failure/COPD  - Renal failure  - Anemia  - Advanced Liver disease  Therapy Plans: HEP Disposition: Home with  wife Planned DVT Prophylaxis: Aspirin 325 mg BID DME Needed: None PCP: Hulan Fess, MD (clearance received) TXA: IV Allergies: Morphine (itching) Anesthesia Concerns: Sleep apnea BMI: 36.8  Other: Plan for 23 hour observation  - Patient was instructed on what medications to stop prior to surgery. - Follow-up visit in 2 weeks with Dr. Wynelle Link - Begin physical therapy following surgery - Pre-operative lab work as pre-surgical testing - Prescriptions will be provided in hospital at time of discharge  Theresa Duty, PA-C Orthopedic Surgery [facility] [place]

## 2019-06-28 NOTE — Progress Notes (Addendum)
PCP - Dr. Hulan Fess. LOV: 05/12/19. : clearance.: chart. Cardiologist -   Chest x-ray -  EKG - 05/12/19. :chart Stress Test -  ECHO -  Cardiac Cath -   Sleep Study - yes CPAP - yes  Fasting Blood Sugar -  Checks Blood Sugar _____ times a day  Blood Thinner Instructions: Aspirin Instructions: Last Dose:  Anesthesia review:   Patient denies shortness of breath, fever, cough and chest pain at PAT appointment   Patient verbalized understanding of instructions that were given to them at the PAT appointment. Patient was also instructed that they will need to review over the PAT instructions again at home before surgery.

## 2019-06-29 ENCOUNTER — Encounter (HOSPITAL_COMMUNITY)
Admission: RE | Admit: 2019-06-29 | Discharge: 2019-06-29 | Disposition: A | Discharge status: Home / Self Care | Payer: Medicare HMO | Source: Ambulatory Visit | Attending: Orthopedic Surgery | Admitting: Orthopedic Surgery

## 2019-06-29 DIAGNOSIS — Z01812 Encounter for preprocedural laboratory examination: Secondary | ICD-10-CM | POA: Diagnosis not present

## 2019-06-29 LAB — COMPREHENSIVE METABOLIC PANEL
ALT: 21 U/L (ref 0–44)
AST: 24 U/L (ref 15–41)
Albumin: 4.2 g/dL (ref 3.5–5.0)
Alkaline Phosphatase: 59 U/L (ref 38–126)
Anion gap: 8 (ref 5–15)
BUN: 21 mg/dL (ref 8–23)
CO2: 26 mmol/L (ref 22–32)
Calcium: 9.4 mg/dL (ref 8.9–10.3)
Chloride: 107 mmol/L (ref 98–111)
Creatinine, Ser: 1.08 mg/dL (ref 0.61–1.24)
GFR calc Af Amer: >60 mL/min (ref >60)
GFR calc non Af Amer: >60 mL/min (ref >60)
Glucose, Bld: 105 mg/dL — ABNORMAL HIGH (ref 70–99)
Potassium: 4.8 mmol/L (ref 3.5–5.1)
Sodium: 141 mmol/L (ref 135–145)
Total Bilirubin: 0.6 mg/dL (ref 0.3–1.2)
Total Protein: 7.1 g/dL (ref 6.5–8.1)

## 2019-06-29 LAB — CBC
HCT: 41.7 % (ref 39.0–52.0)
Hemoglobin: 13.9 g/dL (ref 13.0–17.0)
MCH: 31.5 pg (ref 26.0–34.0)
MCHC: 33.3 g/dL (ref 30.0–36.0)
MCV: 94.6 fL (ref 80.0–100.0)
Platelets: 159 10*3/uL (ref 150–400)
RBC: 4.41 MIL/uL (ref 4.22–5.81)
RDW: 12.5 % (ref 11.5–15.5)
WBC: 6.8 10*3/uL (ref 4.0–10.5)
nRBC: 0 % (ref 0.0–0.2)

## 2019-06-29 LAB — APTT: aPTT: 31 seconds (ref 24–36)

## 2019-06-29 LAB — PROTIME-INR
INR: 1 (ref 0.8–1.2)
Prothrombin Time: 13.2 seconds (ref 11.4–15.2)

## 2019-06-29 LAB — SURGICAL PCR SCREEN
MRSA, PCR: POSITIVE — AB
Staphylococcus aureus: POSITIVE — AB

## 2019-06-29 NOTE — Progress Notes (Signed)
PCR results: positive for MRSA,positive for STAPH.

## 2019-07-01 ENCOUNTER — Other Ambulatory Visit (HOSPITAL_COMMUNITY)
Admission: RE | Admit: 2019-07-01 | Discharge: 2019-07-01 | Disposition: A | Discharge status: Home / Self Care | Payer: Medicare HMO | Source: Ambulatory Visit | Attending: Orthopedic Surgery | Admitting: Orthopedic Surgery

## 2019-07-01 DIAGNOSIS — Z20822 Contact with and (suspected) exposure to covid-19: Secondary | ICD-10-CM | POA: Insufficient documentation

## 2019-07-01 DIAGNOSIS — Z01812 Encounter for preprocedural laboratory examination: Secondary | ICD-10-CM | POA: Diagnosis not present

## 2019-07-01 LAB — SARS CORONAVIRUS 2 (TAT 6-24 HRS): SARS Coronavirus 2: NEGATIVE

## 2019-07-04 MED ORDER — VANCOMYCIN HCL 1500 MG/300ML IV SOLN
1500.0000 mg | INTRAVENOUS | Status: AC
  Administered 2019-07-05: 1500 mg via INTRAVENOUS
  Filled 2019-07-04: qty 300

## 2019-07-04 MED ORDER — DEXTROSE 5 % IV SOLN
3.0000 g | INTRAVENOUS | Status: AC
  Administered 2019-07-05: 3 g via INTRAVENOUS
  Filled 2019-07-04: qty 3

## 2019-07-04 NOTE — Anesthesia Preprocedure Evaluation (Addendum)
Anesthesia Evaluation  Patient identified by MRN, date of birth, ID band Patient awake    Reviewed: Allergy & Precautions, H&P , NPO status , Patient's Chart, lab work & pertinent test results, reviewed documented beta blocker date and time   Airway Mallampati: III  TM Distance: >3 FB Neck ROM: Full    Dental no notable dental hx. (+) Teeth Intact, Dental Advisory Given   Pulmonary neg pulmonary ROS, former smoker,    Pulmonary exam normal breath sounds clear to auscultation       Cardiovascular Exercise Tolerance: Good negative cardio ROS   Rhythm:Regular Rate:Normal     Neuro/Psych  Headaches, Depression    GI/Hepatic Neg liver ROS, GERD  Medicated and Controlled,  Endo/Other  Morbid obesity  Renal/GU negative Renal ROS  negative genitourinary   Musculoskeletal  (+) Arthritis , Osteoarthritis,    Abdominal   Peds  Hematology  (+) Blood dyscrasia, anemia ,   Anesthesia Other Findings   Reproductive/Obstetrics negative OB ROS                            Anesthesia Physical Anesthesia Plan  ASA: II  Anesthesia Plan: Spinal and MAC   Post-op Pain Management:    Induction: Intravenous  PONV Risk Score and Plan: 2 and Ondansetron, Dexamethasone and Propofol infusion  Airway Management Planned: Simple Face Mask  Additional Equipment:   Intra-op Plan:   Post-operative Plan:   Informed Consent: I have reviewed the patients History and Physical, chart, labs and discussed the procedure including the risks, benefits and alternatives for the proposed anesthesia with the patient or authorized representative who has indicated his/her understanding and acceptance.     Dental advisory given  Plan Discussed with: CRNA  Anesthesia Plan Comments:         Anesthesia Quick Evaluation

## 2019-07-05 ENCOUNTER — Other Ambulatory Visit: Payer: Self-pay

## 2019-07-05 ENCOUNTER — Inpatient Hospital Stay (HOSPITAL_COMMUNITY)
Admission: RE | Admit: 2019-07-05 | Discharge: 2019-07-06 | DRG: 470 | Disposition: A | Discharge status: Home / Self Care | Payer: Medicare HMO | Attending: Orthopedic Surgery | Admitting: Orthopedic Surgery

## 2019-07-05 ENCOUNTER — Inpatient Hospital Stay (HOSPITAL_COMMUNITY): Payer: Medicare HMO | Admitting: Anesthesiology

## 2019-07-05 ENCOUNTER — Encounter (HOSPITAL_COMMUNITY): Payer: Self-pay | Admitting: Orthopedic Surgery

## 2019-07-05 ENCOUNTER — Encounter (HOSPITAL_COMMUNITY)
Admission: RE | Disposition: A | Discharge status: Home / Self Care | Payer: Self-pay | Source: Home / Self Care | Attending: Orthopedic Surgery

## 2019-07-05 ENCOUNTER — Ambulatory Visit (HOSPITAL_COMMUNITY): Payer: Medicare HMO

## 2019-07-05 ENCOUNTER — Inpatient Hospital Stay (HOSPITAL_COMMUNITY): Payer: Medicare HMO

## 2019-07-05 DIAGNOSIS — M1611 Unilateral primary osteoarthritis, right hip: Secondary | ICD-10-CM | POA: Diagnosis not present

## 2019-07-05 DIAGNOSIS — Z96651 Presence of right artificial knee joint: Secondary | ICD-10-CM | POA: Diagnosis not present

## 2019-07-05 DIAGNOSIS — Z6836 Body mass index (BMI) 36.0-36.9, adult: Secondary | ICD-10-CM | POA: Diagnosis not present

## 2019-07-05 DIAGNOSIS — K219 Gastro-esophageal reflux disease without esophagitis: Secondary | ICD-10-CM | POA: Diagnosis not present

## 2019-07-05 DIAGNOSIS — Z87891 Personal history of nicotine dependence: Secondary | ICD-10-CM

## 2019-07-05 DIAGNOSIS — R7303 Prediabetes: Secondary | ICD-10-CM | POA: Diagnosis not present

## 2019-07-05 DIAGNOSIS — Z471 Aftercare following joint replacement surgery: Secondary | ICD-10-CM | POA: Diagnosis not present

## 2019-07-05 DIAGNOSIS — M1610 Unilateral primary osteoarthritis, unspecified hip: Secondary | ICD-10-CM

## 2019-07-05 DIAGNOSIS — D62 Acute posthemorrhagic anemia: Secondary | ICD-10-CM | POA: Diagnosis not present

## 2019-07-05 DIAGNOSIS — K449 Diaphragmatic hernia without obstruction or gangrene: Secondary | ICD-10-CM | POA: Diagnosis not present

## 2019-07-05 DIAGNOSIS — Z96641 Presence of right artificial hip joint: Secondary | ICD-10-CM | POA: Diagnosis not present

## 2019-07-05 DIAGNOSIS — M169 Osteoarthritis of hip, unspecified: Secondary | ICD-10-CM | POA: Diagnosis present

## 2019-07-05 DIAGNOSIS — Z419 Encounter for procedure for purposes other than remedying health state, unspecified: Secondary | ICD-10-CM

## 2019-07-05 DIAGNOSIS — Z96649 Presence of unspecified artificial hip joint: Secondary | ICD-10-CM

## 2019-07-05 DIAGNOSIS — Z885 Allergy status to narcotic agent status: Secondary | ICD-10-CM

## 2019-07-05 HISTORY — PX: TOTAL HIP ARTHROPLASTY: SHX124

## 2019-07-05 LAB — TYPE AND SCREEN
ABO/RH(D): O POS
Antibody Screen: NEGATIVE

## 2019-07-05 SURGERY — ARTHROPLASTY, HIP, TOTAL, ANTERIOR APPROACH
Anesthesia: Monitor Anesthesia Care | Site: Hip | Laterality: Right

## 2019-07-05 MED ORDER — PHENYLEPHRINE HCL-NACL 10-0.9 MG/250ML-% IV SOLN
INTRAVENOUS | Status: DC | PRN
  Administered 2019-07-05: 50 ug/min via INTRAVENOUS

## 2019-07-05 MED ORDER — DOCUSATE SODIUM 100 MG PO CAPS
100.0000 mg | ORAL_CAPSULE | Freq: Two times a day (BID) | ORAL | Status: DC
  Administered 2019-07-05 – 2019-07-06 (×2): 100 mg via ORAL
  Filled 2019-07-05 (×2): qty 1

## 2019-07-05 MED ORDER — DEXAMETHASONE SODIUM PHOSPHATE 10 MG/ML IJ SOLN
8.0000 mg | Freq: Once | INTRAMUSCULAR | Status: AC
  Administered 2019-07-05: 09:00:00 10 mg via INTRAVENOUS

## 2019-07-05 MED ORDER — FLEET ENEMA 7-19 GM/118ML RE ENEM: 1.0000 | ENEMA | Freq: Once | RECTAL | Status: DC | PRN

## 2019-07-05 MED ORDER — MIDAZOLAM HCL 5 MG/5ML IJ SOLN
INTRAMUSCULAR | Status: DC | PRN
  Administered 2019-07-05: 2 mg via INTRAVENOUS

## 2019-07-05 MED ORDER — FENTANYL CITRATE (PF) 100 MCG/2ML IJ SOLN: 25.0000 ug | INTRAMUSCULAR | Status: DC | PRN

## 2019-07-05 MED ORDER — BISACODYL 10 MG RE SUPP
10.0000 mg | Freq: Every day | RECTAL | Status: DC | PRN
  Administered 2019-07-05: 10 mg via RECTAL
  Filled 2019-07-05: qty 1

## 2019-07-05 MED ORDER — METOPROLOL TARTRATE 50 MG PO TABS
50.0000 mg | ORAL_TABLET | Freq: Every day | ORAL | Status: DC
  Administered 2019-07-06: 50 mg via ORAL
  Filled 2019-07-05: qty 1

## 2019-07-05 MED ORDER — MIDAZOLAM HCL 2 MG/2ML IJ SOLN
INTRAMUSCULAR | Status: AC
  Filled 2019-07-05: qty 2

## 2019-07-05 MED ORDER — FENTANYL CITRATE (PF) 100 MCG/2ML IJ SOLN
INTRAMUSCULAR | Status: AC
  Filled 2019-07-05: qty 2

## 2019-07-05 MED ORDER — DEXAMETHASONE SODIUM PHOSPHATE 10 MG/ML IJ SOLN
INTRAMUSCULAR | Status: AC
  Filled 2019-07-05: qty 1

## 2019-07-05 MED ORDER — ACETAMINOPHEN 10 MG/ML IV SOLN
1000.0000 mg | Freq: Four times a day (QID) | INTRAVENOUS | Status: DC
  Administered 2019-07-05: 1000 mg via INTRAVENOUS
  Filled 2019-07-05: qty 100

## 2019-07-05 MED ORDER — DIPHENHYDRAMINE HCL 50 MG/ML IJ SOLN
12.5000 mg | Freq: Once | INTRAMUSCULAR | Status: AC
  Administered 2019-07-05: 12.5 mg via INTRAVENOUS

## 2019-07-05 MED ORDER — BUPIVACAINE HCL (PF) 0.25 % IJ SOLN
INTRAMUSCULAR | Status: AC
  Filled 2019-07-05: qty 30

## 2019-07-05 MED ORDER — PANTOPRAZOLE SODIUM 40 MG PO TBEC: 40.0000 mg | DELAYED_RELEASE_TABLET | Freq: Every day | ORAL | Status: DC | PRN

## 2019-07-05 MED ORDER — HYDROMORPHONE HCL 2 MG PO TABS
2.0000 mg | ORAL_TABLET | ORAL | Status: DC | PRN
  Administered 2019-07-05 – 2019-07-06 (×5): 2 mg via ORAL
  Filled 2019-07-05 (×5): qty 1

## 2019-07-05 MED ORDER — LACTATED RINGERS IV SOLN: INTRAVENOUS | Status: DC

## 2019-07-05 MED ORDER — METOCLOPRAMIDE HCL 5 MG/ML IJ SOLN: 5.0000 mg | Freq: Three times a day (TID) | INTRAMUSCULAR | Status: DC | PRN

## 2019-07-05 MED ORDER — METOCLOPRAMIDE HCL 5 MG PO TABS: 5.0000 mg | ORAL_TABLET | Freq: Three times a day (TID) | ORAL | Status: DC | PRN

## 2019-07-05 MED ORDER — POLYETHYLENE GLYCOL 3350 17 G PO PACK: 17.0000 g | PACK | Freq: Every day | ORAL | Status: DC | PRN

## 2019-07-05 MED ORDER — ONDANSETRON HCL 4 MG PO TABS: 4.0000 mg | ORAL_TABLET | Freq: Four times a day (QID) | ORAL | Status: DC | PRN

## 2019-07-05 MED ORDER — ALLOPURINOL 100 MG PO TABS
200.0000 mg | ORAL_TABLET | Freq: Every day | ORAL | Status: DC
  Administered 2019-07-06: 200 mg via ORAL
  Filled 2019-07-05: qty 2

## 2019-07-05 MED ORDER — PHENOL 1.4 % MT LIQD: 1.0000 | OROMUCOSAL | Status: DC | PRN

## 2019-07-05 MED ORDER — TRANEXAMIC ACID-NACL 1000-0.7 MG/100ML-% IV SOLN
1000.0000 mg | INTRAVENOUS | Status: AC
  Administered 2019-07-05: 1000 mg via INTRAVENOUS
  Filled 2019-07-05: qty 100

## 2019-07-05 MED ORDER — DEXAMETHASONE SODIUM PHOSPHATE 10 MG/ML IJ SOLN: INTRAMUSCULAR | Status: DC | PRN

## 2019-07-05 MED ORDER — CEFAZOLIN SODIUM-DEXTROSE 2-4 GM/100ML-% IV SOLN
2.0000 g | Freq: Four times a day (QID) | INTRAVENOUS | Status: AC
  Administered 2019-07-05 (×2): 2 g via INTRAVENOUS
  Filled 2019-07-05 (×2): qty 100

## 2019-07-05 MED ORDER — ASPIRIN EC 325 MG PO TBEC
325.0000 mg | DELAYED_RELEASE_TABLET | Freq: Two times a day (BID) | ORAL | Status: DC
  Administered 2019-07-06: 325 mg via ORAL
  Filled 2019-07-05: qty 1

## 2019-07-05 MED ORDER — 0.9 % SODIUM CHLORIDE (POUR BTL) OPTIME
TOPICAL | Status: DC | PRN
  Administered 2019-07-05: 1000 mL

## 2019-07-05 MED ORDER — METHOCARBAMOL 500 MG IVPB - SIMPLE MED
INTRAVENOUS | Status: AC
  Filled 2019-07-05: qty 50

## 2019-07-05 MED ORDER — ACETAMINOPHEN 500 MG PO TABS
500.0000 mg | ORAL_TABLET | Freq: Four times a day (QID) | ORAL | Status: AC
  Administered 2019-07-05 – 2019-07-06 (×4): 500 mg via ORAL
  Filled 2019-07-05 (×4): qty 1

## 2019-07-05 MED ORDER — PAROXETINE HCL 20 MG PO TABS
20.0000 mg | ORAL_TABLET | Freq: Every day | ORAL | Status: DC
  Administered 2019-07-06: 20 mg via ORAL
  Filled 2019-07-05: qty 1

## 2019-07-05 MED ORDER — BUPIVACAINE HCL 0.25 % IJ SOLN
INTRAMUSCULAR | Status: DC | PRN
  Administered 2019-07-05: 30 mL

## 2019-07-05 MED ORDER — PROPOFOL 1000 MG/100ML IV EMUL
INTRAVENOUS | Status: AC
  Filled 2019-07-05: qty 100

## 2019-07-05 MED ORDER — SODIUM CHLORIDE 0.9 % IV SOLN: INTRAVENOUS | Status: DC

## 2019-07-05 MED ORDER — METHOCARBAMOL 500 MG IVPB - SIMPLE MED
500.0000 mg | Freq: Four times a day (QID) | INTRAVENOUS | Status: DC | PRN
  Filled 2019-07-05: qty 50

## 2019-07-05 MED ORDER — CHLORHEXIDINE GLUCONATE 4 % EX LIQD: 60.0000 mL | Freq: Once | CUTANEOUS | Status: DC

## 2019-07-05 MED ORDER — FENTANYL CITRATE (PF) 100 MCG/2ML IJ SOLN
INTRAMUSCULAR | Status: DC | PRN
  Administered 2019-07-05: 100 ug via INTRAVENOUS

## 2019-07-05 MED ORDER — ONDANSETRON HCL 4 MG/2ML IJ SOLN
INTRAMUSCULAR | Status: DC | PRN
  Administered 2019-07-05: 4 mg via INTRAVENOUS

## 2019-07-05 MED ORDER — DIPHENHYDRAMINE HCL 12.5 MG/5ML PO ELIX
12.5000 mg | ORAL_SOLUTION | ORAL | Status: DC | PRN
  Administered 2019-07-06: 25 mg via ORAL
  Filled 2019-07-05: qty 10

## 2019-07-05 MED ORDER — WATER FOR IRRIGATION, STERILE IR SOLN
Status: DC | PRN
  Administered 2019-07-05: 1000 mL

## 2019-07-05 MED ORDER — PROPOFOL 500 MG/50ML IV EMUL
INTRAVENOUS | Status: DC | PRN
  Administered 2019-07-05: 75 ug/kg/min via INTRAVENOUS

## 2019-07-05 MED ORDER — DIPHENHYDRAMINE HCL 50 MG/ML IJ SOLN
INTRAMUSCULAR | Status: AC
  Filled 2019-07-05: qty 1

## 2019-07-05 MED ORDER — MENTHOL 3 MG MT LOZG: 1.0000 | LOZENGE | OROMUCOSAL | Status: DC | PRN

## 2019-07-05 MED ORDER — DEXAMETHASONE SODIUM PHOSPHATE 10 MG/ML IJ SOLN
10.0000 mg | Freq: Once | INTRAMUSCULAR | Status: AC
  Administered 2019-07-06: 10 mg via INTRAVENOUS
  Filled 2019-07-05: qty 1

## 2019-07-05 MED ORDER — METHOCARBAMOL 500 MG PO TABS
500.0000 mg | ORAL_TABLET | Freq: Four times a day (QID) | ORAL | Status: DC | PRN
  Administered 2019-07-05 (×2): 500 mg via ORAL
  Filled 2019-07-05 (×2): qty 1

## 2019-07-05 MED ORDER — HYDROMORPHONE HCL 1 MG/ML IJ SOLN: 1.0000 mg | INTRAMUSCULAR | Status: DC | PRN

## 2019-07-05 MED ORDER — PROPOFOL 10 MG/ML IV BOLUS
INTRAVENOUS | Status: DC | PRN
  Administered 2019-07-05: 20 mg via INTRAVENOUS

## 2019-07-05 MED ORDER — HYDROMORPHONE HCL 2 MG PO TABS
4.0000 mg | ORAL_TABLET | ORAL | Status: DC | PRN
  Administered 2019-07-05: 4 mg via ORAL
  Filled 2019-07-05: qty 2

## 2019-07-05 MED ORDER — SODIUM CHLORIDE 0.9 % IV SOLN
INTRAVENOUS | Status: DC | PRN
  Administered 2019-07-05 (×2): 160 ug via INTRAVENOUS

## 2019-07-05 MED ORDER — ONDANSETRON HCL 4 MG/2ML IJ SOLN: 4.0000 mg | Freq: Four times a day (QID) | INTRAMUSCULAR | Status: DC | PRN

## 2019-07-05 MED ORDER — PROPOFOL 10 MG/ML IV BOLUS
INTRAVENOUS | Status: AC
  Filled 2019-07-05: qty 20

## 2019-07-05 MED ORDER — ALBUMIN HUMAN 5 % IV SOLN: INTRAVENOUS | Status: DC | PRN

## 2019-07-05 MED ORDER — POVIDONE-IODINE 10 % EX SWAB
2.0000 "application " | Freq: Once | CUTANEOUS | Status: AC
  Administered 2019-07-05: 2 via TOPICAL

## 2019-07-05 MED ORDER — ONDANSETRON HCL 4 MG/2ML IJ SOLN
INTRAMUSCULAR | Status: AC
  Filled 2019-07-05: qty 2

## 2019-07-05 SURGICAL SUPPLY — 47 items
BAG DECANTER FOR FLEXI CONT (MISCELLANEOUS) IMPLANT
BAG SPEC THK2 15X12 ZIP CLS (MISCELLANEOUS)
BAG ZIPLOCK 12X15 (MISCELLANEOUS) IMPLANT
BLADE SAG 18X100X1.27 (BLADE) ×2 IMPLANT
CLSR STERI-STRIP ANTIMIC 1/2X4 (GAUZE/BANDAGES/DRESSINGS) ×1 IMPLANT
COVER PERINEAL POST (MISCELLANEOUS) ×2 IMPLANT
COVER SURGICAL LIGHT HANDLE (MISCELLANEOUS) ×2 IMPLANT
COVER WAND RF STERILE (DRAPES) IMPLANT
CUP ACETBLR 54 OD PINNACLE (Hips) ×1 IMPLANT
DECANTER SPIKE VIAL GLASS SM (MISCELLANEOUS) ×2 IMPLANT
DRAPE STERI IOBAN 125X83 (DRAPES) ×2 IMPLANT
DRAPE U-SHAPE 47X51 STRL (DRAPES) ×4 IMPLANT
DRSG ADAPTIC 3X8 NADH LF (GAUZE/BANDAGES/DRESSINGS) ×2 IMPLANT
DRSG AQUACEL AG ADV 3.5X10 (GAUZE/BANDAGES/DRESSINGS) ×2 IMPLANT
DRSG MEPILEX BORDER 4X8 (GAUZE/BANDAGES/DRESSINGS) ×1 IMPLANT
DURAPREP 26ML APPLICATOR (WOUND CARE) ×2 IMPLANT
ELECT REM PT RETURN 15FT ADLT (MISCELLANEOUS) ×2 IMPLANT
EVACUATOR 1/8 PVC DRAIN (DRAIN) IMPLANT
GLOVE BIO SURGEON STRL SZ 6 (GLOVE) IMPLANT
GLOVE BIO SURGEON STRL SZ7 (GLOVE) IMPLANT
GLOVE BIO SURGEON STRL SZ8 (GLOVE) ×2 IMPLANT
GLOVE BIOGEL PI IND STRL 6.5 (GLOVE) IMPLANT
GLOVE BIOGEL PI IND STRL 7.0 (GLOVE) IMPLANT
GLOVE BIOGEL PI IND STRL 8 (GLOVE) ×1 IMPLANT
GLOVE BIOGEL PI INDICATOR 6.5 (GLOVE)
GLOVE BIOGEL PI INDICATOR 7.0 (GLOVE)
GLOVE BIOGEL PI INDICATOR 8 (GLOVE) ×1
GOWN STRL REUS W/TWL LRG LVL3 (GOWN DISPOSABLE) ×2 IMPLANT
GOWN STRL REUS W/TWL XL LVL3 (GOWN DISPOSABLE) IMPLANT
HEAD CERAMIC 36 PLUS5 (Hips) ×1 IMPLANT
HOLDER FOLEY CATH W/STRAP (MISCELLANEOUS) ×2 IMPLANT
KIT TURNOVER KIT A (KITS) IMPLANT
LINER MARATHON NEUT +4X54X36 (Hips) ×1 IMPLANT
MANIFOLD NEPTUNE II (INSTRUMENTS) ×2 IMPLANT
PACK ANTERIOR HIP CUSTOM (KITS) ×2 IMPLANT
PENCIL SMOKE EVACUATOR COATED (MISCELLANEOUS) ×2 IMPLANT
STEM FEMORAL SZ6 HIGH ACTIS (Stem) ×1 IMPLANT
STRIP CLOSURE SKIN 1/2X4 (GAUZE/BANDAGES/DRESSINGS) ×2 IMPLANT
SUT ETHIBOND NAB CT1 #1 30IN (SUTURE) ×2 IMPLANT
SUT MNCRL AB 4-0 PS2 18 (SUTURE) ×2 IMPLANT
SUT STRATAFIX 0 PDS 27 VIOLET (SUTURE) ×2
SUT VIC AB 2-0 CT1 27 (SUTURE) ×4
SUT VIC AB 2-0 CT1 TAPERPNT 27 (SUTURE) ×2 IMPLANT
SUTURE STRATFX 0 PDS 27 VIOLET (SUTURE) ×1 IMPLANT
SYR 50ML LL SCALE MARK (SYRINGE) IMPLANT
TRAY FOLEY MTR SLVR 16FR STAT (SET/KITS/TRAYS/PACK) ×2 IMPLANT
YANKAUER SUCT BULB TIP 10FT TU (MISCELLANEOUS) ×2 IMPLANT

## 2019-07-05 NOTE — Interval H&P Note (Signed)
History and Physical Interval Note:  07/05/2019 8:00 AM  Craig Rice  has presented today for surgery, with the diagnosis of right hip osteoarthritis.  The various methods of treatment have been discussed with the patient and family. After consideration of risks, benefits and other options for treatment, the patient has consented to  Procedure(s) with comments: Chrisney (Right) - 127min as a surgical intervention.  The patient's history has been reviewed, patient examined, no change in status, stable for surgery.  I have reviewed the patient's chart and labs.  Questions were answered to the patient's satisfaction.     Craig Rice

## 2019-07-05 NOTE — Anesthesia Procedure Notes (Signed)
Spinal  Patient location during procedure: OR Start time: 07/05/2019 8:28 AM End time: 07/05/2019 8:30 AM Staffing Performed: anesthesiologist  Anesthesiologist: Roderic Palau, MD Preanesthetic Checklist Completed: patient identified, IV checked, risks and benefits discussed, surgical consent, monitors and equipment checked, pre-op evaluation and timeout performed Spinal Block Patient position: sitting Prep: DuraPrep Patient monitoring: cardiac monitor, continuous pulse ox and blood pressure Approach: midline Location: L3-4 Injection technique: single-shot Needle Needle type: Pencan  Needle gauge: 24 G Needle length: 9 cm Assessment Sensory level: T8 Additional Notes Functioning IV was confirmed and monitors were applied. Sterile prep and drape, including hand hygiene and sterile gloves were used. The patient was positioned and the spine was prepped. The skin was anesthetized with lidocaine.  Free flow of clear CSF was obtained prior to injecting local anesthetic into the CSF.  The spinal needle aspirated freely following injection.  The needle was carefully withdrawn.  The patient tolerated the procedure well. Procedure attempted by crna prior to my attempt.

## 2019-07-05 NOTE — Anesthesia Postprocedure Evaluation (Signed)
Anesthesia Post Note  Patient: LEDARRIUS BEAUCHAINE  Procedure(s) Performed: TOTAL HIP ARTHROPLASTY ANTERIOR APPROACH (Right Hip)     Patient location during evaluation: PACU Anesthesia Type: MAC and Spinal Level of consciousness: oriented and awake and alert Pain management: pain level controlled Vital Signs Assessment: post-procedure vital signs reviewed and stable Respiratory status: spontaneous breathing and respiratory function stable Cardiovascular status: blood pressure returned to baseline and stable Postop Assessment: no headache, no backache, no apparent nausea or vomiting, spinal receding and patient able to bend at knees Anesthetic complications: no    Last Vitals:  Vitals:   07/05/19 1030 07/05/19 1100  BP: 105/70 112/69  Pulse: 67 63  Resp: 13 17  Temp: 36.4 C   SpO2: 95% 99%    Last Pain:  Vitals:   07/05/19 1100  TempSrc:   PainSc: 0-No pain                 Morrison Mcbryar,W. EDMOND

## 2019-07-05 NOTE — Op Note (Signed)
OPERATIVE REPORT- TOTAL HIP ARTHROPLASTY   PREOPERATIVE DIAGNOSIS: Osteoarthritis of the Right hip.   POSTOPERATIVE DIAGNOSIS: Osteoarthritis of the Right  hip.   PROCEDURE: Right total hip arthroplasty, anterior approach.   SURGEON: Gaynelle Arabian, MD   ASSISTANT: Griffith Citron, PA-C  ANESTHESIA:  Spinal  ESTIMATED BLOOD LOSS:-450 mL    DRAINS: Hemovac x1.   COMPLICATIONS: None   CONDITION: PACU - hemodynamically stable.   BRIEF CLINICAL NOTE: Craig Rice is a 74 y.o. male who has advanced end-  stage arthritis of their Right  hip with progressively worsening pain and  dysfunction.The patient has failed nonoperative management and presents for  total hip arthroplasty.   PROCEDURE IN DETAIL: After successful administration of spinal  anesthetic, the traction boots for the [facility] bed were placed on both  feet and the patient was placed onto the [facility] bed, boots placed into the leg  holders. The Right hip was then isolated from the perineum with plastic  drapes and prepped and draped in the usual sterile fashion. ASIS and  greater trochanter were marked and a oblique incision was made, starting  at about 1 cm lateral and 2 cm distal to the ASIS and coursing towards  the anterior cortex of the femur. The skin was cut with a 10 blade  through subcutaneous tissue to the level of the fascia overlying the  tensor fascia lata muscle. The fascia was then incised in line with the  incision at the junction of the anterior third and posterior 2/3rd. The  muscle was teased off the fascia and then the interval between the TFL  and the rectus was developed. The Hohmann retractor was then placed at  the top of the femoral neck over the capsule. The vessels overlying the  capsule were cauterized and the fat on top of the capsule was removed.  A Hohmann retractor was then placed anterior underneath the rectus  femoris to give exposure to the entire anterior capsule. A T-shaped   capsulotomy was performed. The edges were tagged and the femoral head  was identified.       Osteophytes are removed off the superior acetabulum.  The femoral neck was then cut in situ with an oscillating saw. Traction  was then applied to the left lower extremity utilizing the [facility]  traction. The femoral head was then removed. Retractors were placed  around the acetabulum and then circumferential removal of the labrum was  performed. Osteophytes were also removed. Reaming starts at 49 mm to  medialize and  Increased in 2 mm increments to 53 mm. We reamed in  approximately 40 degrees of abduction, 20 degrees anteversion. A 54 mm  pinnacle acetabular shell was then impacted in anatomic position under  fluoroscopic guidance with excellent purchase. We did not need to place  any additional dome screws. A 36 mm neutral + 4 marathon liner was then  placed into the acetabular shell.       The femoral lift was then placed along the lateral aspect of the femur  just distal to the vastus ridge. The leg was  externally rotated and capsule  was stripped off the inferior aspect of the femoral neck down to the  level of the lesser trochanter, this was done with electrocautery. The femur was lifted after this was performed. The  leg was then placed in an extended and adducted position essentially delivering the femur. We also removed the capsule superiorly and the piriformis from the piriformis  fossa to gain excellent exposure of the  proximal femur. Rongeur was used to remove some cancellous bone to get  into the lateral portion of the proximal femur for placement of the  initial starter reamer. The starter broaches was placed  the starter broach  and was shown to go down the center of the canal. Broaching  with the Actis system was then performed starting at size 0  coursing  Up to size 6. A size 6 had excellent torsional and rotational  and axial stability. The trial high offset neck was then placed   with a 36 + 5 trial head. The hip was then reduced. We confirmed that  the stem was in the canal both on AP and lateral x-rays. It also has excellent sizing. The hip was reduced with outstanding stability through full extension and full external rotation.. AP pelvis was taken and the leg lengths were measured and found to be equal. Hip was then dislocated again and the femoral head and neck removed. The  femoral broach was removed. Size 6 Actis stem with a high offset  neck was then impacted into the femur following native anteversion. Has  excellent purchase in the canal. Excellent torsional and rotational and  axial stability. It is confirmed to be in the canal on AP and lateral  fluoroscopic views. The 36 + 5 ceramic head was placed and the hip  reduced with outstanding stability. Again AP pelvis was taken and it  confirmed that the leg lengths were equal. The wound was then copiously  irrigated with saline solution and the capsule reattached and repaired  with Ethibond suture. 30 ml of .25% Bupivicaine was  injected into the capsule and into the edge of the tensor fascia lata as well as subcutaneous tissue. The fascia overlying the tensor fascia lata was then closed with a running #1 V-Loc. Subcu was closed with interrupted 2-0 Vicryl and subcuticular running 4-0 Monocryl. Incision was cleaned  and dried. Steri-Strips and a bulky sterile dressing applied. Hemovac  drain was hooked to suction and then the patient was awakened and transported to  recovery in stable condition.        Please note that a surgical assistant was a medical necessity for this procedure to perform it in a safe and expeditious manner. Assistant was necessary to provide appropriate retraction of vital neurovascular structures and to prevent femoral fracture and allow for anatomic placement of the prosthesis.  Gaynelle Arabian, M.D.

## 2019-07-05 NOTE — Evaluation (Signed)
Physical Therapy Evaluation Patient Details Name: Craig Rice MRN: [id number] DOB: May 19, 1945 Today's Date: 07/05/2019   History of Present Illness  s/p R DA THA. PMH: R TKA  Clinical Impression  Pt is s/p THA resulting in the deficits listed below (see PT Problem List).  Pt amb ~ 32' and then 10' more to bathroom.  Pt reports he is frustrated about being constipated. Should continue to progress well in acute setting. Hemovac came out on 2nd sit to stand/amb to bathroom, RN aware.   Pt will benefit from skilled PT to increase their independence and safety with mobility to allow discharge to the venue listed below.      Follow Up Recommendations Follow surgeon's recommendation for DC plan and follow-up therapies    Equipment Recommendations  None recommended by PT    Recommendations for Other Services       Precautions / Restrictions Precautions Precautions: Fall Restrictions Weight Bearing Restrictions: No Other Position/Activity Restrictions: WBAT      Mobility  Bed Mobility               General bed mobility comments: NT--pt in chair on arrival  Transfers Overall transfer level: Needs assistance Equipment used: Rolling walker (2 wheeled) Transfers: Sit to/from Stand Sit to Stand: Min guard         General transfer comment: cues for hand placement for varied surfaces; sit to stand x2 from chair, stand to sit on [facility]  Ambulation/Gait Ambulation/Gait assistance: Min guard;Min assist Gait Distance (Feet): 60 Feet(10' more) Assistive device: Rolling walker (2 wheeled) Gait Pattern/deviations: Step-to pattern;Decreased stance time - right     General Gait Details: cues for sequence initially  Stairs            Wheelchair Mobility    Modified Rankin (Stroke Patients Only)       Balance                                             Pertinent Vitals/Pain Pain Assessment: 0-10 Pain Score: 7  Pain Location: right hip Pain  Descriptors / Indicators: Grimacing;Discomfort;Sore Pain Intervention(s): Limited activity within patient's tolerance;Monitored during session;Premedicated before session;Repositioned;Ice applied    Home Living Family/patient expects to be discharged to:: Private residence Living Arrangements: Spouse/significant other Available Help at Discharge: Family Type of Home: House Home Access: Stairs to enter Entrance Stairs-Rails: Right Entrance Stairs-Number of Steps: 2 Home Layout: Able to live on main level with bedroom/bathroom;Two level Home Equipment: Walker - 2 wheels;Bedside commode      Prior Function                 Hand Dominance        Extremity/Trunk Assessment   Upper Extremity Assessment Upper Extremity Assessment: Overall WFL for tasks assessed    Lower Extremity Assessment Lower Extremity Assessment: RLE deficits/detail RLE Deficits / Details: ankle and knee grossly WFL; hip limited by pain       Communication   Communication: No difficulties  Cognition Arousal/Alertness: Awake/alert Behavior During Therapy: WFL for tasks assessed/performed Overall Cognitive Status: Within Functional Limits for tasks assessed                                        General Comments      Exercises  Total Joint Exercises Ankle Circles/Pumps: Other (comment);Limitations Ankle Circles/Pumps Limitations: pt left  in restroom at his request; reviewed ankle pumps with pt wife, she will encourage pt to do them later   Assessment/Plan    PT Assessment Patient needs continued PT services  PT Problem List Decreased mobility;Decreased activity tolerance;Decreased balance;Decreased knowledge of use of DME;Pain;Decreased strength       PT Treatment Interventions DME instruction;Therapeutic exercise;Gait training;Stair training;Functional mobility training;Therapeutic activities;Patient/family education    PT Goals (Current goals can be found in the Care Plan  section)  Acute Rehab PT Goals Patient Stated Goal: have less pain PT Goal Formulation: With patient Time For Goal Achievement: 07/12/19 Potential to Achieve Goals: Good    Frequency 7X/week   Barriers to discharge        Co-evaluation               AM-PAC PT "6 Clicks" Mobility  Outcome Measure Help needed turning from your back to your side while in a flat bed without using bedrails?: A Little Help needed moving from lying on your back to sitting on the side of a flat bed without using bedrails?: A Little Help needed moving to and from a bed to a chair (including a wheelchair)?: A Little Help needed standing up from a chair using your arms (e.g., wheelchair or bedside chair)?: A Little Help needed to walk in hospital room?: A Little Help needed climbing 3-5 steps with a railing? : A Little 6 Click Score: 18    End of Session Equipment Utilized During Treatment: Gait belt Activity Tolerance: Patient tolerated treatment well Patient left: Other (comment)(in bathroom at his request, RN adn NT aware) Nurse Communication: Mobility status PT Visit Diagnosis: Difficulty in walking, not elsewhere classified (R26.2)    Time: [id number] PT Time Calculation (min) (ACUTE ONLY): 16 min   Charges:   PT Evaluation $PT Eval Low Complexity: Azure, PT   Acute Rehab Dept [facility]): [id number]   07/05/2019   [facility] 07/05/2019, 2:58 PM

## 2019-07-05 NOTE — Anesthesia Procedure Notes (Signed)
Procedure Name: MAC Date/Time: 07/05/2019 8:20 AM Performed by: Lissa Morales, CRNA Pre-anesthesia Checklist: Patient identified, Emergency Drugs available, Suction available, Patient being monitored and Timeout performed Patient Re-evaluated:Patient Re-evaluated prior to induction Oxygen Delivery Method: Simple face mask Placement Confirmation: positive ETCO2

## 2019-07-05 NOTE — Discharge Instructions (Addendum)
Craig Arabian, MD Total Joint Specialist [facility] [address] [place], [place] [address] [phone]  ANTERIOR APPROACH TOTAL HIP REPLACEMENT POSTOPERATIVE DIRECTIONS     Hip Rehabilitation, Guidelines Following Surgery  The results of a hip operation are greatly improved after range of motion and muscle strengthening exercises. Follow all safety measures which are given to protect your hip. If any of these exercises cause increased pain or swelling in your joint, decrease the amount until you are comfortable again. Then slowly increase the exercises. Call your caregiver if you have problems or questions.   BLOOD CLOT PREVENTION . Take a 325 mg Aspirin two times a day for three weeks following surgery. Then take an 81 mg Aspirin once a day for three weeks. Then discontinue Aspirin. [name] may resume your vitamins/supplements upon discharge from the hospital. . Do not take any NSAIDs (Advil, Aleve, Ibuprofen, Meloxicam, etc.) until you have discontinued the 325 mg Aspirin.  HOME CARE INSTRUCTIONS  . Remove items at home which could result in a fall. This includes throw rugs or furniture in walking pathways.   ICE to the affected hip as frequently as 20-30 minutes an hour and then as needed for pain and swelling. Continue to use ice on the hip for pain and swelling from surgery. You may notice swelling that will progress down to the foot and ankle. This is normal after surgery. Elevate the leg when you are not up walking on it.    Continue to use the breathing machine which will help keep your temperature down.  It is common for your temperature to cycle up and down following surgery, especially at night when you are not up moving around and exerting yourself.  The breathing machine keeps your lungs expanded and your temperature down.  DIET You may resume your previous home diet once your are discharged from the hospital.  DRESSING / WOUND CARE /  SHOWERING . You have an adhesive waterproof bandage over the incision. Leave this in place until your first follow-up appointment. Once you remove this you will not need to place another bandage.  . You may begin showering 3 days following surgery, but do not submerge the incision under water.  ACTIVITY . For the first 3-5 days, it is important to rest and keep the operative leg elevated. You should, as a general rule, rest for 50 minutes and walk/stretch for 10 minutes per hour. After 5 days, you may slowly increase activity as tolerated.  Marland Kitchen Perform the exercises you were provided twice a day for about 15-20 minutes each session. Begin these 2 days following surgery. . Walk with your walker as instructed. Use the walker until you are comfortable transitioning to a cane. Walk with the cane in the opposite hand of the operative leg. You may discontinue the cane once you are comfortable and walking steadily. . Avoid periods of inactivity such as sitting longer than an hour when not asleep. This helps prevent blood clots.  . Do not drive a car for 6 weeks or until released by your surgeon.  . Do not drive while taking narcotics.  TED HOSE STOCKINGS Wear the elastic stockings on both legs for three weeks following surgery during the day. You may remove them at night while sleeping.  WEIGHT BEARING Weight bearing as tolerated with assist device (walker, cane, etc) as directed, use it as long as suggested by your surgeon or therapist, typically at least 4-6 weeks.  POSTOPERATIVE CONSTIPATION PROTOCOL Constipation -  defined medically as fewer than three stools per week and severe constipation as less than one stool per week.  One of the most common issues patients have following surgery is constipation.  Even if you have a regular bowel pattern at home, your normal regimen is likely to be disrupted due to multiple reasons following surgery.  Combination of anesthesia, postoperative narcotics, change in  appetite and fluid intake all can affect your bowels.  In order to avoid complications following surgery, here are some recommendations in order to help you during your recovery period.  . Colace (docusate) - Pick up an over-the-counter form of Colace or another stool softener and take twice a day as long as you are requiring postoperative pain medications.  Take with a full glass of water daily.  If you experience loose stools or diarrhea, hold the colace until you stool forms back up.  If your symptoms do not get better within 1 week or if they get worse, check with your doctor. . Dulcolax (bisacodyl) - Pick up over-the-counter and take as directed by the product packaging as needed to assist with the movement of your bowels.  Take with a full glass of water.  Use this product as needed if not relieved by Colace only.  . MiraLax (polyethylene glycol) - Pick up over-the-counter to have on hand.  MiraLax is a solution that will increase the amount of water in your bowels to assist with bowel movements.  Take as directed and can mix with a glass of water, juice, soda, coffee, or tea.  Take if you go more than two days without a movement.Do not use MiraLax more than once per day. Call your doctor if you are still constipated or irregular after using this medication for 7 days in a row.  If you continue to have problems with postoperative constipation, please contact the office for further assistance and recommendations.  If you experience "the worst abdominal pain ever" or develop nausea or vomiting, please contact the office immediatly for further recommendations for treatment.  ITCHING  If you experience itching with your medications, try taking only a single pain pill, or even half a pain pill at a time.  You can also use Benadryl over the counter for itching or also to help with sleep.   MEDICATIONS See your medication summary on the "After Visit Summary" that the nursing staff will review with you  prior to discharge.  You may have some home medications which will be placed on hold until you complete the course of blood thinner medication.  It is important for you to complete the blood thinner medication as prescribed by your surgeon.  Continue your approved medications as instructed at time of discharge.  PRECAUTIONS If you experience chest pain or shortness of breath - call 911 immediately for transfer to the hospital emergency department.  If you develop a fever greater that 101 F, purulent drainage from wound, increased redness or drainage from wound, foul odor from the wound/dressing, or calf pain - CONTACT YOUR SURGEON.                                                   FOLLOW-UP APPOINTMENTS Make sure you keep all of your appointments after your operation with your surgeon and caregivers. You should call the office at the above phone number and  make an appointment for approximately two weeks after the date of your surgery or on the date instructed by your surgeon outlined in the "After Visit Summary".  RANGE OF MOTION AND STRENGTHENING EXERCISES  These exercises are designed to help you keep full movement of your hip joint. Follow your caregiver's or physical therapist's instructions. Perform all exercises about fifteen times, three times per day or as directed. Exercise both hips, even if you have had only one joint replacement. These exercises can be done on a training (exercise) mat, on the floor, on a table or on a bed. Use whatever works the best and is most comfortable for you. Use music or television while you are exercising so that the exercises are a pleasant break in your day. This will make your life better with the exercises acting as a break in routine you can look forward to.  . Lying on your back, slowly slide your foot toward your buttocks, raising your knee up off the floor. Then slowly slide your foot back down until your leg is straight again.  . Lying on your back spread  your legs as far apart as you can without causing discomfort.  . Lying on your side, raise your upper leg and foot straight up from the floor as far as is comfortable. Slowly lower the leg and repeat.  . Lying on your back, tighten up the muscle in the front of your thigh (quadriceps muscles). You can do this by keeping your leg straight and trying to raise your heel off the floor. This helps strengthen the largest muscle supporting your knee.  . Lying on your back, tighten up the muscles of your buttocks both with the legs straight and with the knee bent at a comfortable angle while keeping your heel on the floor.   IF YOU ARE TRANSFERRED TO A SKILLED REHAB FACILITY If the patient is transferred to a skilled rehab facility following release from the hospital, a list of the current medications will be sent to the facility for the patient to continue.  When discharged from the skilled rehab facility, please have the facility set up the patient's Home Health Physical Therapy prior to being released. Also, the skilled facility will be responsible for providing the patient with their medications at time of release from the facility to include their pain medication, the muscle relaxants, and their blood thinner medication. If the patient is still at the rehab facility at time of the two week follow up appointment, the skilled rehab facility will also need to assist the patient in arranging follow up appointment in our office and any transportation needs.  MAKE SURE YOU:  . Understand these instructions.  . Get help right away if you are not doing well or get worse.    DENTAL ANTIBIOTICS:  In most cases prophylactic antibiotics for Dental procdeures after total joint surgery are not necessary.  Exceptions are as follows:  1. History of prior total joint infection  2. Severely immunocompromised (Organ Transplant, cancer chemotherapy, Rheumatoid biologic meds such as Humera)  3. Poorly controlled  diabetes (A1C &gt; 8.0, blood glucose over 200)  If you have one of these conditions, contact your surgeon for an antibiotic prescription, prior to your dental procedure.    Pick up stool softner and laxative for home use following surgery while on pain medications. Do not submerge incision under water. Please use good hand washing techniques while changing dressing each day. May shower starting three days after surgery. Please   use a clean towel to pat the incision dry following showers. Continue to use ice for pain and swelling after surgery. Do not use any lotions or creams on the incision until instructed by your surgeon.  

## 2019-07-05 NOTE — Transfer of Care (Signed)
Immediate Anesthesia Transfer of Care Note  Patient: Craig Rice  Procedure(s) Performed: TOTAL HIP ARTHROPLASTY ANTERIOR APPROACH (Right Hip)  Patient Location: PACU  Anesthesia Type:Spinal  Level of Consciousness: awake, alert  and oriented  Airway & Oxygen Therapy: Patient Spontanous Breathing and Patient connected to face mask oxygen  Post-op Assessment: Report given to RN and Post -op Vital signs reviewed and stable  Post vital signs: stable  Last Vitals:  Vitals Value Taken Time  BP 122/72 07/05/19 1135  Temp 36.4 C 07/05/19 1135  Pulse 56 07/05/19 1135  Resp 16 07/05/19 1135  SpO2 99 % 07/05/19 1135    Last Pain:  Vitals:   07/05/19 1135  TempSrc: Oral  PainSc: 0-No pain         Complications: No apparent anesthesia complications

## 2019-07-06 ENCOUNTER — Encounter: Payer: Self-pay | Admitting: *Deleted

## 2019-07-06 LAB — BASIC METABOLIC PANEL
Anion gap: 11 (ref 5–15)
BUN: 20 mg/dL (ref 8–23)
CO2: 22 mmol/L (ref 22–32)
Calcium: 8.4 mg/dL — ABNORMAL LOW (ref 8.9–10.3)
Chloride: 103 mmol/L (ref 98–111)
Creatinine, Ser: 0.99 mg/dL (ref 0.61–1.24)
GFR calc Af Amer: >60 mL/min (ref >60)
GFR calc non Af Amer: >60 mL/min (ref >60)
Glucose, Bld: 177 mg/dL — ABNORMAL HIGH (ref 70–99)
Potassium: 4.6 mmol/L (ref 3.5–5.1)
Sodium: 136 mmol/L (ref 135–145)

## 2019-07-06 LAB — CBC
HCT: 34.8 % — ABNORMAL LOW (ref 39.0–52.0)
Hemoglobin: 11.3 g/dL — ABNORMAL LOW (ref 13.0–17.0)
MCH: 30.9 pg (ref 26.0–34.0)
MCHC: 32.5 g/dL (ref 30.0–36.0)
MCV: 95.1 fL (ref 80.0–100.0)
Platelets: 141 10*3/uL — ABNORMAL LOW (ref 150–400)
RBC: 3.66 MIL/uL — ABNORMAL LOW (ref 4.22–5.81)
RDW: 12.2 % (ref 11.5–15.5)
WBC: 12.5 10*3/uL — ABNORMAL HIGH (ref 4.0–10.5)
nRBC: 0 % (ref 0.0–0.2)

## 2019-07-06 MED ORDER — METHOCARBAMOL 500 MG PO TABS: 500.0000 mg | ORAL_TABLET | Freq: Four times a day (QID) | ORAL | 0 refills | Status: DC | PRN

## 2019-07-06 MED ORDER — ASPIRIN 325 MG PO TBEC: 325.0000 mg | DELAYED_RELEASE_TABLET | Freq: Two times a day (BID) | ORAL | 0 refills | Status: AC

## 2019-07-06 MED ORDER — HYDROMORPHONE HCL 2 MG PO TABS: 2.0000 mg | ORAL_TABLET | Freq: Four times a day (QID) | ORAL | 0 refills | Status: DC | PRN

## 2019-07-06 NOTE — Progress Notes (Signed)
Physical Therapy Treatment Patient Details Name: Craig Rice MRN: [id number] DOB: 1946-03-16 Today's Date: 07/06/2019    History of Present Illness s/p R DA THA. PMH: R TKA    PT Comments    POD # 1 Assisted OOB.  General bed mobility comments: demonstarted and instructed how to use gait belt to self assist LE.  General transfer comment: <25% VC's on proper hand placement and safety with turns.  General Gait Details: <25% VC's on proper sequencing as well as upright posture.General stair comments: 25% VC's on proper sequencing and safety.  Then returned to room to perform some TE's following HEP handout.  Instructed on proper tech, freq as well as use of ICE.   Addressed all mobility questions, discussed appropriate activity, educated on use of ICE.  Pt ready for D/C to home.    Follow Up Recommendations  Follow surgeon's recommendation for DC plan and follow-up therapies     Equipment Recommendations  None recommended by PT    Recommendations for Other Services       Precautions / Restrictions    Mobility  Bed Mobility Overal bed mobility: Needs Assistance Bed Mobility: Supine to Sit     Supine to sit: Supervision     General bed mobility comments: demonstarted and instructed how to use gait belt to self assist LE  Transfers Overall transfer level: Needs assistance Equipment used: Rolling walker (2 wheeled) Transfers: Sit to/from Stand;Stand Pivot Transfers Sit to Stand: Supervision Stand pivot transfers: Supervision       General transfer comment: <25% VC's on proper hand placement and safety with turns  Ambulation/Gait Ambulation/Gait assistance: Supervision Gait Distance (Feet): 85 Feet Assistive device: Rolling walker (2 wheeled) Gait Pattern/deviations: Step-to pattern;Decreased stance time - right Gait velocity: decreased   General Gait Details: <25% VC's on proper sequencing as well as upright posture.   Stairs Stairs: Yes Stairs assistance:  Supervision;Min assist Stair Management: Step to pattern;Forwards Number of Stairs: 2 General stair comments: 25% VC's on proper sequencing and safety   Wheelchair Mobility    Modified Rankin (Stroke Patients Only)       Balance                                            Cognition Arousal/Alertness: Awake/alert Behavior During Therapy: WFL for tasks assessed/performed Overall Cognitive Status: Within Functional Limits for tasks assessed                                 General Comments: AxO X 3 well educated      Exercises   Total Hip Replacement TE's following HEP Handout 10 reps ankle pumps 05 reps knee presses 05 reps heel slides 05 reps SAQ's 05 reps ABD Instructed how to use a belt loop to assist  Followed by ICE     General Comments        Pertinent Vitals/Pain Pain Assessment: 0-10 Pain Score: 5  Pain Location: right hip Pain Descriptors / Indicators: Grimacing;Discomfort;Sore;Operative site guarding Pain Intervention(s): Premedicated before session;Monitored during session;Repositioned;Ice applied    Home Living                      Prior Function            PT Goals (current goals can now be  found in the care plan section) Progress towards PT goals: Progressing toward goals    Frequency    7X/week      PT Plan Current plan remains appropriate    Co-evaluation              AM-PAC PT "6 Clicks" Mobility   Outcome Measure  Help needed turning from your back to your side while in a flat bed without using bedrails?: None Help needed moving from lying on your back to sitting on the side of a flat bed without using bedrails?: None Help needed moving to and from a bed to a chair (including a wheelchair)?: None Help needed standing up from a chair using your arms (e.g., wheelchair or bedside chair)?: None Help needed to walk in hospital room?: None Help needed climbing 3-5 steps with a railing?  : A Little 6 Click Score: 23    End of Session Equipment Utilized During Treatment: Gait belt Activity Tolerance: Patient tolerated treatment well Patient left: in chair;with call bell/phone within reach Nurse Communication: Mobility status PT Visit Diagnosis: Difficulty in walking, not elsewhere classified (R26.2)     Time: [id number] PT Time Calculation (min) (ACUTE ONLY): 40 min  Charges:  $Therapeutic Exercise: 8-22 mins $Therapeutic Activity: 8-22 mins                     Rica Koyanagi  PTA Acute  Rehabilitation Services Pager      [phone] Office      [phone]

## 2019-07-06 NOTE — TOC Progression Note (Signed)
Transition of Care [facility]) - Progression Note    Patient Details  Name: Craig Rice MRN: [id number] Date of Birth: 1945-03-18  Transition of Care [facility]) CM/SW [place], LCSW Phone Number: 07/06/2019, 10:23 AM  Clinical Narrative:    Therapy Plan: HEP Confirm patient has RW and 3 in1      Barriers to Discharge: No Barriers Identified  Expected Discharge Plan and Services           Expected Discharge Date: 07/06/19               DME Arranged: N/A DME Agency: NA       HH Arranged: NA HH Agency: NA         Social Determinants of Health (SDOH) Interventions    Readmission Risk Interventions No flowsheet data found.

## 2019-07-06 NOTE — Progress Notes (Signed)
Orthopedic Tech Progress Note Patient Details:  Craig Rice [birth year] [id number]  Ortho Devices Ortho Device/Splint Location: Trapeze bar Ortho Device/Splint Interventions: Application   Post Interventions Patient Tolerated: Well Instructions Provided: Care of device   Craig Rice 07/06/2019, 9:22 AM

## 2019-07-06 NOTE — Progress Notes (Signed)
   Subjective: 1 Day Post-Op Procedure(s) (LRB): TOTAL HIP ARTHROPLASTY ANTERIOR APPROACH (Right) Patient reports pain as mild.   Patient seen in rounds with Dr. Wynelle Link. Patient is well, and has had no acute complaints or problems. Right knee pain has improved this AM. Denies chest pain or SOB. Hopeful for discharge by this afternoon. Foley catheter removed, no issues overnight. We will continue therapy today, ambulated 20' yesterday.   Objective: Vital signs in last 24 hours: Temp:  [97.6 F (36.4 C)-98.5 F (36.9 C)] 98.4 F (36.9 C) (04/22 0512) Pulse Rate:  [56-88] 78 (04/22 0512) Resp:  [13-18] 16 (04/22 0512) BP: (105-127)/(69-75) 123/70 (04/22 0512) SpO2:  [95 %-100 %] 99 % (04/22 0512) Weight:  [117.9 kg] 117.9 kg (04/21 1321)  Intake/Output from previous day:  Intake/Output Summary (Last 24 hours) at 07/06/2019 0746 Last data filed at 07/06/2019 0600 Gross per 24 hour  Intake 5382.43 ml  Output 3535 ml  Net 1847.43 ml     Intake/Output this shift: No intake/output data recorded.  Labs: Recent Labs    07/06/19 0339  HGB 11.3*   Recent Labs    07/06/19 0339  WBC 12.5*  RBC 3.66*  HCT 34.8*  PLT 141*   Recent Labs    07/06/19 0339  NA 136  K 4.6  CL 103  CO2 22  BUN 20  CREATININE 0.99  GLUCOSE 177*  CALCIUM 8.4*   No results for input(s): LABPT, INR in the last 72 hours.  Exam: General - Patient is Alert and Oriented Extremity - Neurologically intact Neurovascular intact Sensation intact distally Dorsiflexion/Plantar flexion intact Dressing - dressing C/D/I Motor Function - intact, moving foot and toes well on exam.   Past Medical History:  Diagnosis Date  . Arthritis    osteoarthritis -knee  . Depression   . GERD (gastroesophageal reflux disease)   . H/O hiatal hernia   . Headache(784.0)    tx. migraines-"Metoprolol"  . Pre-diabetes    manage with diet.    Assessment/Plan: 1 Day Post-Op Procedure(s) (LRB): TOTAL HIP  ARTHROPLASTY ANTERIOR APPROACH (Right) Principal Problem:   OA (osteoarthritis) of hip Active Problems:   Osteoarthritis of hip  Estimated body mass index is 36.39 kg/m as calculated from the following:   Height as of this encounter: 5' 10.87" (1.8 m).   Weight as of this encounter: 117.9 kg. Advance diet Up with therapy D/C IV fluids  DVT Prophylaxis - Aspirin Weight bearing as tolerated. D/C O2 and pulse ox and try on room air. Hemovac accidentally pulled out last night. Continue therapy.  Plan is to go Home after hospital stay. Plan for discharge with HEP around lunchtime if cleared by PT. Follow-up in the office in 2 weeks.   Theresa Duty, PA-C Orthopedic Surgery [phone] 07/06/2019, 7:46 AM

## 2019-07-07 DIAGNOSIS — G4733 Obstructive sleep apnea (adult) (pediatric): Secondary | ICD-10-CM | POA: Diagnosis not present

## 2019-07-07 NOTE — Discharge Summary (Signed)
Physician Discharge Summary   Patient ID: Craig Rice MRN: [id number] DOB/AGE: 74-Jun-1947 74 y.o.  Admit date: 07/05/2019 Discharge date: 07/06/2019  Primary Diagnosis: Osteoarthritis, right hip   Admission Diagnoses:  Past Medical History:  Diagnosis Date   Arthritis    osteoarthritis -knee   Depression    GERD (gastroesophageal reflux disease)    H/O hiatal hernia    Headache(784.0)    tx. migraines-"Metoprolol"   Pre-diabetes    manage with diet.   Discharge Diagnoses:   Principal Problem:   OA (osteoarthritis) of hip Active Problems:   Osteoarthritis of hip  Estimated body mass index is 36.39 kg/m as calculated from the following:   Height as of this encounter: 5' 10.87" (1.8 m).   Weight as of this encounter: 117.9 kg.  Procedure:  Procedure(s) (LRB): TOTAL HIP ARTHROPLASTY ANTERIOR APPROACH (Right)   Consults: None  HPI: Craig Rice is a 74 y.o. male who has advanced end-stage arthritis of their Right  hip with progressively worsening pain and dysfunction.The patient has failed nonoperative management and presents for total hip arthroplasty.   Laboratory Data: Admission on 07/05/2019, Discharged on 07/06/2019  Component Date Value Ref Range Status   WBC 07/06/2019 12.5* 4.0 - 10.5 K/uL Final   RBC 07/06/2019 3.66* 4.22 - 5.81 MIL/uL Final   Hemoglobin 07/06/2019 11.3* 13.0 - 17.0 g/dL Final   HCT 07/06/2019 34.8* 39.0 - 52.0 % Final   MCV 07/06/2019 95.1  80.0 - 100.0 fL Final   MCH 07/06/2019 30.9  26.0 - 34.0 pg Final   MCHC 07/06/2019 32.5  30.0 - 36.0 g/dL Final   RDW 07/06/2019 12.2  11.5 - 15.5 % Final   Platelets 07/06/2019 141* 150 - 400 K/uL Final   nRBC 07/06/2019 0.0  0.0 - 0.2 % Final   Performed at [facility], [place] [address], [place], [place] [address]   Sodium 07/06/2019 136  135 - 145 mmol/L Final   Potassium 07/06/2019 4.6  3.5 - 5.1 mmol/L Final   Chloride 07/06/2019 103  98 - 111 mmol/L Final     CO2 07/06/2019 22  22 - 32 mmol/L Final   Glucose, Bld 07/06/2019 177* 70 - 99 mg/dL Final   Glucose reference range applies only to samples taken after fasting for at least 8 hours.   BUN 07/06/2019 20  8 - 23 mg/dL Final   Creatinine, Ser 07/06/2019 0.99  0.61 - 1.24 mg/dL Final   Calcium 07/06/2019 8.4* 8.9 - 10.3 mg/dL Final   GFR calc non Af Amer 07/06/2019 >60  >60 mL/min Final   GFR calc Af Amer 07/06/2019 >60  >60 mL/min Final   Anion gap 07/06/2019 11  5 - 15 Final   Performed at [facility], [place] [address] [place], [place] [address]  Hospital Outpatient Visit on 07/01/2019  Component Date Value Ref Range Status   SARS Coronavirus 2 07/01/2019 NEGATIVE  NEGATIVE Final   Comment: (NOTE) SARS-CoV-2 target nucleic acids are NOT DETECTED. The SARS-CoV-2 RNA is generally detectable in upper and lower respiratory specimens during the acute phase of infection. Negative results do not preclude SARS-CoV-2 infection, do not rule out co-infections with other pathogens, and should not be used as the sole basis for treatment or other patient management decisions. Negative results must be combined with clinical observations, patient history, and epidemiological information. The expected result is Negative. Fact Sheet for Patients: SugarRoll.be Fact Sheet for Healthcare Providers: https://www.woods-mathews.com/ This test is not yet  approved or cleared by the [place] and  has been authorized for detection and/or diagnosis of SARS-CoV-2 by FDA under an Emergency Use Authorization (EUA). This EUA will remain  in effect (meaning this test can be used) for the duration of the COVID-19 declaration under Section 56                          4(b)(1) of the Act, 21 U.S.C. section 360bbb-3(b)(1), unless the authorization is terminated or revoked sooner. Performed at [facility], [place] [address]  [place], [place] [address]   Hospital Outpatient Visit on 06/29/2019  Component Date Value Ref Range Status   MRSA, PCR 06/29/2019 POSITIVE* NEGATIVE Final   Comment: RESULT CALLED TO, READ BACK BY AND VERIFIED WITH: PHILLIPS,K. RN @1607  ON 04.15.2021 BY COHEN,K    Staphylococcus aureus 06/29/2019 POSITIVE* NEGATIVE Final   Comment: (NOTE) The Xpert SA Assay (FDA approved for NASAL specimens in patients 56 years of age and older), is one component of a comprehensive surveillance program. It is not intended to diagnose infection nor to guide or monitor treatment. Performed at [facility] [place] [address], [place], [place] [address]    aPTT 06/29/2019 31  24 - 36 seconds Final   Performed at [facility], [place] [address] [place], [place] [address]   WBC 06/29/2019 6.8  4.0 - 10.5 K/uL Final   RBC 06/29/2019 4.41  4.22 - 5.81 MIL/uL Final   Hemoglobin 06/29/2019 13.9  13.0 - 17.0 g/dL Final   HCT 06/29/2019 41.7  39.0 - 52.0 % Final   MCV 06/29/2019 94.6  80.0 - 100.0 fL Final   MCH 06/29/2019 31.5  26.0 - 34.0 pg Final   MCHC 06/29/2019 33.3  30.0 - 36.0 g/dL Final   RDW 06/29/2019 12.5  11.5 - 15.5 % Final   Platelets 06/29/2019 159  150 - 400 K/uL Final   nRBC 06/29/2019 0.0  0.0 - 0.2 % Final   Performed at [facility], [place] [address] [place], [place] [address]   Sodium 06/29/2019 141  135 - 145 mmol/L Final   Potassium 06/29/2019 4.8  3.5 - 5.1 mmol/L Final   Chloride 06/29/2019 107  98 - 111 mmol/L Final   CO2 06/29/2019 26  22 - 32 mmol/L Final   Glucose, Bld 06/29/2019 105* 70 - 99 mg/dL Final   Glucose reference range applies only to samples taken after fasting for at least 8 hours.   BUN 06/29/2019 21  8 - 23 mg/dL Final   Creatinine, Ser 06/29/2019 1.08  0.61 - 1.24 mg/dL Final   Calcium 06/29/2019 9.4  8.9 - 10.3 mg/dL Final   Total Protein 06/29/2019 7.1  6.5 - 8.1 g/dL Final   Albumin  06/29/2019 4.2  3.5 - 5.0 g/dL Final   AST 06/29/2019 24  15 - 41 U/L Final   ALT 06/29/2019 21  0 - 44 U/L Final   Alkaline Phosphatase 06/29/2019 59  38 - 126 U/L Final   Total Bilirubin 06/29/2019 0.6  0.3 - 1.2 mg/dL Final   GFR calc non Af Amer 06/29/2019 >60  >60 mL/min Final   GFR calc Af Amer 06/29/2019 >60  >60 mL/min Final   Anion gap 06/29/2019 8  5 - 15 Final   Performed at [facility], [place] [address], [place], [place] [address]   Prothrombin Time 06/29/2019 13.2  11.4 - 15.2 seconds  Final   INR 06/29/2019 1.0  0.8 - 1.2 Final   Comment: (NOTE) INR goal varies based on device and disease states. Performed at [facility], [place] [address] [place], [place] [address]    ABO/RH(D) 06/29/2019 O POS   Final   Antibody Screen 06/29/2019 NEG   Final   Sample Expiration 06/29/2019 07/08/2019,2359   Final   Extend sample reason 06/29/2019    Final                   Value:NO TRANSFUSIONS OR PREGNANCY IN THE PAST 3 MONTHS Performed at [facility], [place] [address] [place], [place] [address]      X-Rays:DG Pelvis Portable  Result Date: 07/05/2019 CLINICAL DATA:  Status post right total hip arthroplasty. EXAM: PORTABLE PELVIS 1-2 VIEWS COMPARISON:  Earlier today FINDINGS: Right total hip arthroplasty device is identified. The hardware components are in anatomic alignment without periprosthetic fracture. Surgical drain is noted overlying the proximal right femur. IMPRESSION: 1. No complicating features identified status post right total hip arthroplasty Electronically Signed   By: Kerby Moors M.D.   On: 07/05/2019 11:29   DG C-Arm 1-60 Min-No Report  Result Date: 07/05/2019 Fluoroscopy was utilized by the requesting physician.  No radiographic interpretation.   DG HIP OPERATIVE UNILAT W OR W/O PELVIS RIGHT  Result Date: 07/05/2019 CLINICAL DATA:  Status post right hip replacement. EXAM: OPERATIVE right HIP (WITH  PELVIS IF PERFORMED) 3 VIEWS TECHNIQUE: Fluoroscopic spot image(s) were submitted for interpretation post-operatively. FLUOROSCOPY TIME:  13 seconds. COMPARISON:  None. FINDINGS: Three intraoperative fluoroscopic images were obtained of the right hip. These demonstrate the right acetabular and femoral components to be well situated. Expected postoperative changes are noted in the surrounding soft tissues. IMPRESSION: Status post right total hip arthroplasty. Electronically Signed   By: Marijo Conception M.D.   On: 07/05/2019 11:33    EKG:No orders found for this or any previous visit.   Hospital Course: Craig Rice is a 74 y.o. who was admitted to [facility]. They were brought to the operating room on 07/05/2019 and underwent Procedure(s): Interior.  Patient tolerated the procedure well and was later transferred to the recovery room and then to the orthopaedic floor for postoperative care. They were given PO and IV analgesics for pain control following their surgery. They were given 24 hours of postoperative antibiotics of  Anti-infectives (From admission, onward)   Start     Dose/Rate Route Frequency Ordered Stop   07/05/19 1430  ceFAZolin (ANCEF) IVPB 2g/100 mL premix     2 g 200 mL/hr over 30 Minutes Intravenous Every 6 hours 07/05/19 1141 07/05/19 2149   07/05/19 0600  ceFAZolin (ANCEF) 3 g in dextrose 5 % 50 mL IVPB     3 g 100 mL/hr over 30 Minutes Intravenous On call to O.R. 07/04/19 0827 07/05/19 0830   07/05/19 0600  vancomycin (VANCOREADY) IVPB 1500 mg/300 mL     1,500 mg 150 mL/hr over 120 Minutes Intravenous 60 min pre-op 07/04/19 0826 07/05/19 0901     and started on DVT prophylaxis in the form of Aspirin.   PT and OT were ordered for total joint protocol. Discharge planning consulted to help with postop disposition and equipment needs.  Patient had a good night on the evening of surgery, did experience right knee pain which was improved by the  morning of POD #1. They started to get up OOB with  therapy on POD #0. Pt was seen during rounds and was ready to go home pending progress with therapy. Hemovac drain was pulled without difficulty. He worked with therapy on POD #1 and was meeting his goals. Pt was discharged to home later that day in stable condition.  Diet: Regular diet Activity: WBAT Follow-up: in 2 weeks Disposition: Home with HEP Discharged Condition: stable   Discharge Instructions    Call MD / Call 911   Complete by: As directed    If you experience chest pain or shortness of breath, CALL 911 and be transported to the hospital emergency room.  If you develope a fever above 101 F, pus (white drainage) or increased drainage or redness at the wound, or calf pain, call your surgeon's office.   Change dressing   Complete by: As directed    You have an adhesive waterproof bandage over the incision. Leave this in place until your first follow-up appointment. Once you remove this you will not need to place another bandage.   Constipation Prevention   Complete by: As directed    Drink plenty of fluids.  Prune juice may be helpful.  You may use a stool softener, such as Colace (over the counter) 100 mg twice a day.  Use MiraLax (over the counter) for constipation as needed.   Diet - low sodium heart healthy   Complete by: As directed    Do not sit on low chairs, stoools or toilet seats, as it may be difficult to get up from low surfaces   Complete by: As directed    Driving restrictions   Complete by: As directed    No driving for two weeks   TED hose   Complete by: As directed    Use stockings (TED hose) for three weeks on both leg(s).  You may remove them at night for sleeping.   Weight bearing as tolerated   Complete by: As directed      Allergies as of 07/06/2019      Reactions   Morphine And Related Itching      Medication List    TAKE these medications   acetaminophen 650 MG CR tablet Commonly known as:  TYLENOL Take 1,300 mg by mouth in the morning and at bedtime.   allopurinol 100 MG tablet Commonly known as: ZYLOPRIM Take 200 mg by mouth daily.   aspirin 325 MG EC tablet Take 1 tablet (325 mg total) by mouth 2 (two) times daily for 20 days. Then take one 81 mg aspirin once a day for three weeks. Then discontinue aspirin.   cetirizine 10 MG tablet Commonly known as: ZYRTEC Take 5-10 mg by mouth daily as needed for allergies.   HYDROmorphone 2 MG tablet Commonly known as: DILAUDID Take 1-2 tablets (2-4 mg total) by mouth every 6 (six) hours as needed for moderate pain or severe pain.   methocarbamol 500 MG tablet Commonly known as: ROBAXIN Take 1 tablet (500 mg total) by mouth every 6 (six) hours as needed for muscle spasms.   metoprolol tartrate 50 MG tablet Commonly known as: LOPRESSOR Take 50 mg by mouth daily before breakfast.   omeprazole 20 MG tablet Commonly known as: PRILOSEC OTC Take 10-20 mg by mouth daily as needed (acid reflux).   PARoxetine 20 MG tablet Commonly known as: PAXIL Take 20 mg by mouth daily before breakfast.            Discharge Care Instructions  (From admission, onward)  Start     Ordered   07/06/19 0000  Weight bearing as tolerated     07/06/19 0750   07/06/19 0000  Change dressing    Comments: You have an adhesive waterproof bandage over the incision. Leave this in place until your first follow-up appointment. Once you remove this you will not need to place another bandage.   07/06/19 0750         Follow-up Information    Gaynelle Arabian, MD. Schedule an appointment as soon as possible for a visit on 07/20/2019.   Specialty: Orthopedic Surgery Contact information: [address] [place] [address] [phone]           Signed: Theresa Duty, PA-C Orthopedic Surgery 07/07/2019, 9:20 AM

## 2019-07-18 DIAGNOSIS — Z471 Aftercare following joint replacement surgery: Secondary | ICD-10-CM | POA: Diagnosis not present

## 2019-07-18 DIAGNOSIS — Z96641 Presence of right artificial hip joint: Secondary | ICD-10-CM | POA: Diagnosis not present

## 2019-07-21 DIAGNOSIS — R69 Illness, unspecified: Secondary | ICD-10-CM | POA: Diagnosis not present

## 2019-07-21 DIAGNOSIS — I10 Essential (primary) hypertension: Secondary | ICD-10-CM | POA: Diagnosis not present

## 2019-07-21 DIAGNOSIS — E785 Hyperlipidemia, unspecified: Secondary | ICD-10-CM | POA: Diagnosis not present

## 2019-08-06 DIAGNOSIS — G4733 Obstructive sleep apnea (adult) (pediatric): Secondary | ICD-10-CM | POA: Diagnosis not present

## 2019-08-08 DIAGNOSIS — Z96641 Presence of right artificial hip joint: Secondary | ICD-10-CM | POA: Diagnosis not present

## 2019-08-08 DIAGNOSIS — Z471 Aftercare following joint replacement surgery: Secondary | ICD-10-CM | POA: Diagnosis not present

## 2019-08-22 DIAGNOSIS — H5203 Hypermetropia, bilateral: Secondary | ICD-10-CM | POA: Diagnosis not present

## 2019-08-22 DIAGNOSIS — H40033 Anatomical narrow angle, bilateral: Secondary | ICD-10-CM | POA: Diagnosis not present

## 2019-08-22 DIAGNOSIS — H2513 Age-related nuclear cataract, bilateral: Secondary | ICD-10-CM | POA: Diagnosis not present

## 2019-09-06 DIAGNOSIS — G4733 Obstructive sleep apnea (adult) (pediatric): Secondary | ICD-10-CM | POA: Diagnosis not present

## 2019-10-27 DIAGNOSIS — M961 Postlaminectomy syndrome, not elsewhere classified: Secondary | ICD-10-CM | POA: Diagnosis not present

## 2019-10-27 DIAGNOSIS — I700 Atherosclerosis of aorta: Secondary | ICD-10-CM | POA: Diagnosis not present

## 2019-10-27 DIAGNOSIS — M5416 Radiculopathy, lumbar region: Secondary | ICD-10-CM | POA: Diagnosis not present

## 2019-10-27 DIAGNOSIS — M545 Low back pain: Secondary | ICD-10-CM | POA: Diagnosis not present

## 2019-11-07 DIAGNOSIS — Z23 Encounter for immunization: Secondary | ICD-10-CM | POA: Diagnosis not present

## 2019-12-12 DIAGNOSIS — M5416 Radiculopathy, lumbar region: Secondary | ICD-10-CM | POA: Diagnosis not present

## 2019-12-15 DIAGNOSIS — M5416 Radiculopathy, lumbar region: Secondary | ICD-10-CM | POA: Diagnosis not present

## 2019-12-19 DIAGNOSIS — M5416 Radiculopathy, lumbar region: Secondary | ICD-10-CM | POA: Diagnosis not present

## 2019-12-19 DIAGNOSIS — M961 Postlaminectomy syndrome, not elsewhere classified: Secondary | ICD-10-CM | POA: Diagnosis not present

## 2020-01-02 DIAGNOSIS — M5416 Radiculopathy, lumbar region: Secondary | ICD-10-CM | POA: Diagnosis not present

## 2020-01-31 DIAGNOSIS — M47816 Spondylosis without myelopathy or radiculopathy, lumbar region: Secondary | ICD-10-CM | POA: Diagnosis not present

## 2020-03-08 DIAGNOSIS — G4733 Obstructive sleep apnea (adult) (pediatric): Secondary | ICD-10-CM | POA: Diagnosis not present

## 2020-04-03 DIAGNOSIS — G4733 Obstructive sleep apnea (adult) (pediatric): Secondary | ICD-10-CM | POA: Diagnosis not present

## 2020-04-08 DIAGNOSIS — G4733 Obstructive sleep apnea (adult) (pediatric): Secondary | ICD-10-CM | POA: Diagnosis not present

## 2020-05-09 DIAGNOSIS — G4733 Obstructive sleep apnea (adult) (pediatric): Secondary | ICD-10-CM | POA: Diagnosis not present

## 2020-05-30 DIAGNOSIS — Z96641 Presence of right artificial hip joint: Secondary | ICD-10-CM | POA: Diagnosis not present

## 2020-05-30 DIAGNOSIS — M25552 Pain in left hip: Secondary | ICD-10-CM | POA: Diagnosis not present

## 2020-06-15 IMAGING — RF DG HIP (WITH PELVIS) OPERATIVE*R*
1 series · 3 of 3 positions shown · non-contrast
Comparison: None.

CLINICAL DATA: Status post right hip replacement.

EXAM:
OPERATIVE right HIP (WITH PELVIS IF PERFORMED) 3 VIEWS
TECHNIQUE: Fluoroscopic spot image(s) were submitted for interpretation
post-operatively.
FLUOROSCOPY TIME:  13 seconds.

[Series 1: unknown protocol · 0.20mm/px · 3 of 3 slices shown]
[im 1/3]
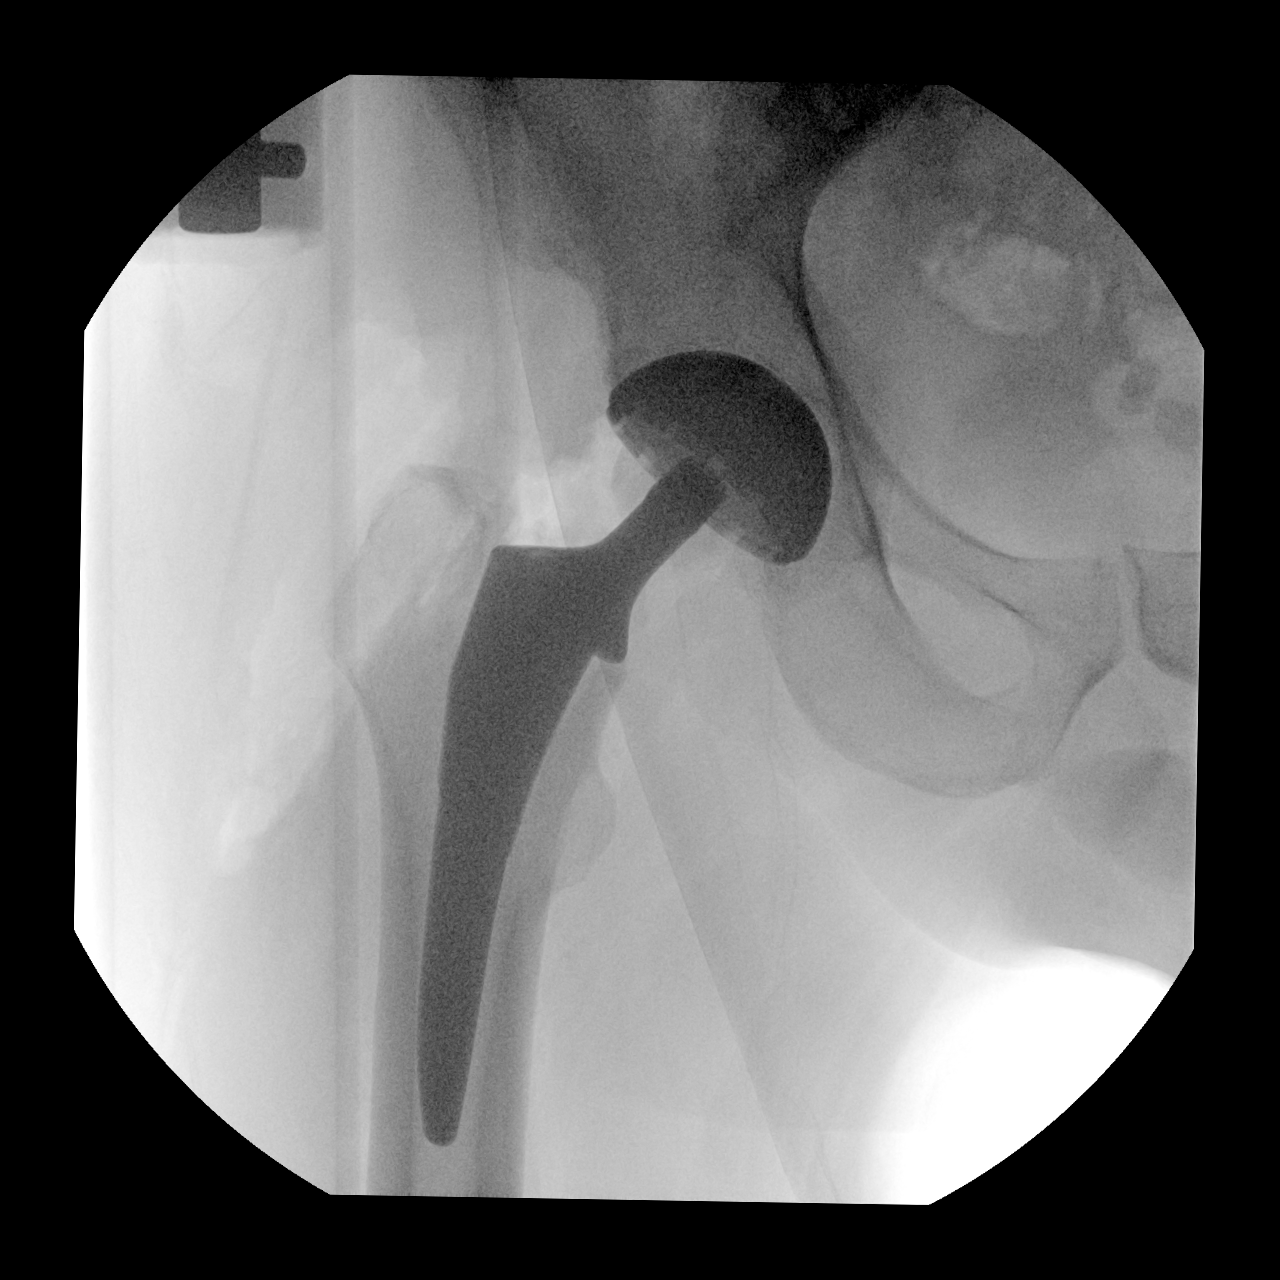
[im 2/3]
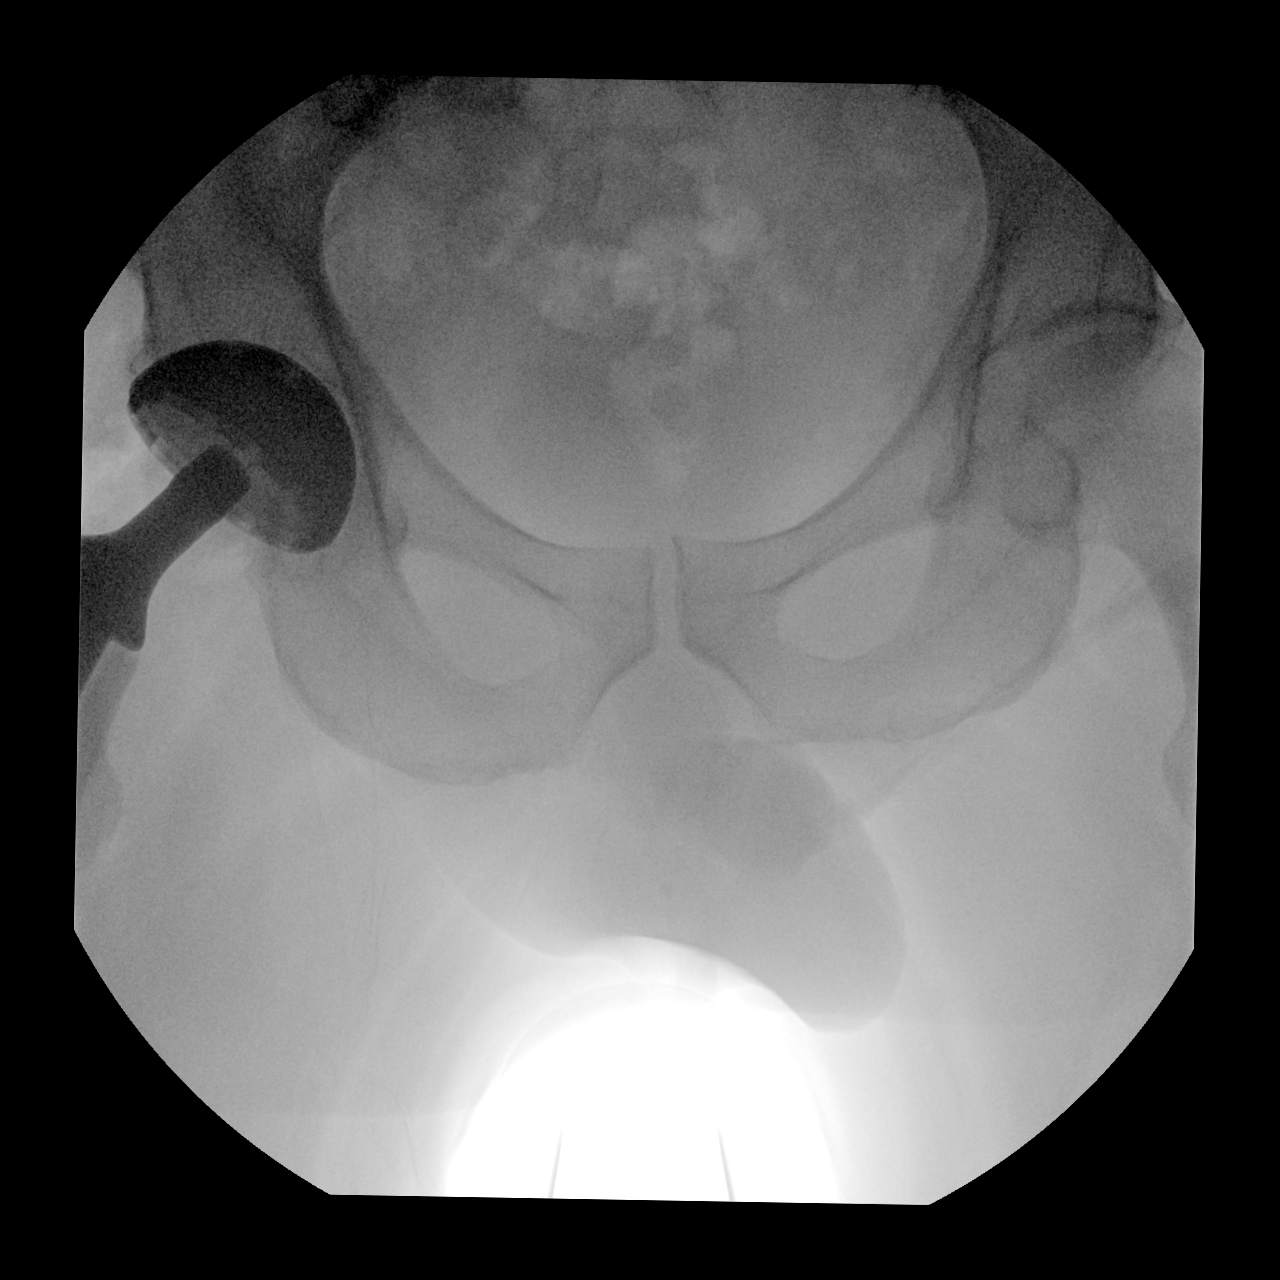
[im 3/3]
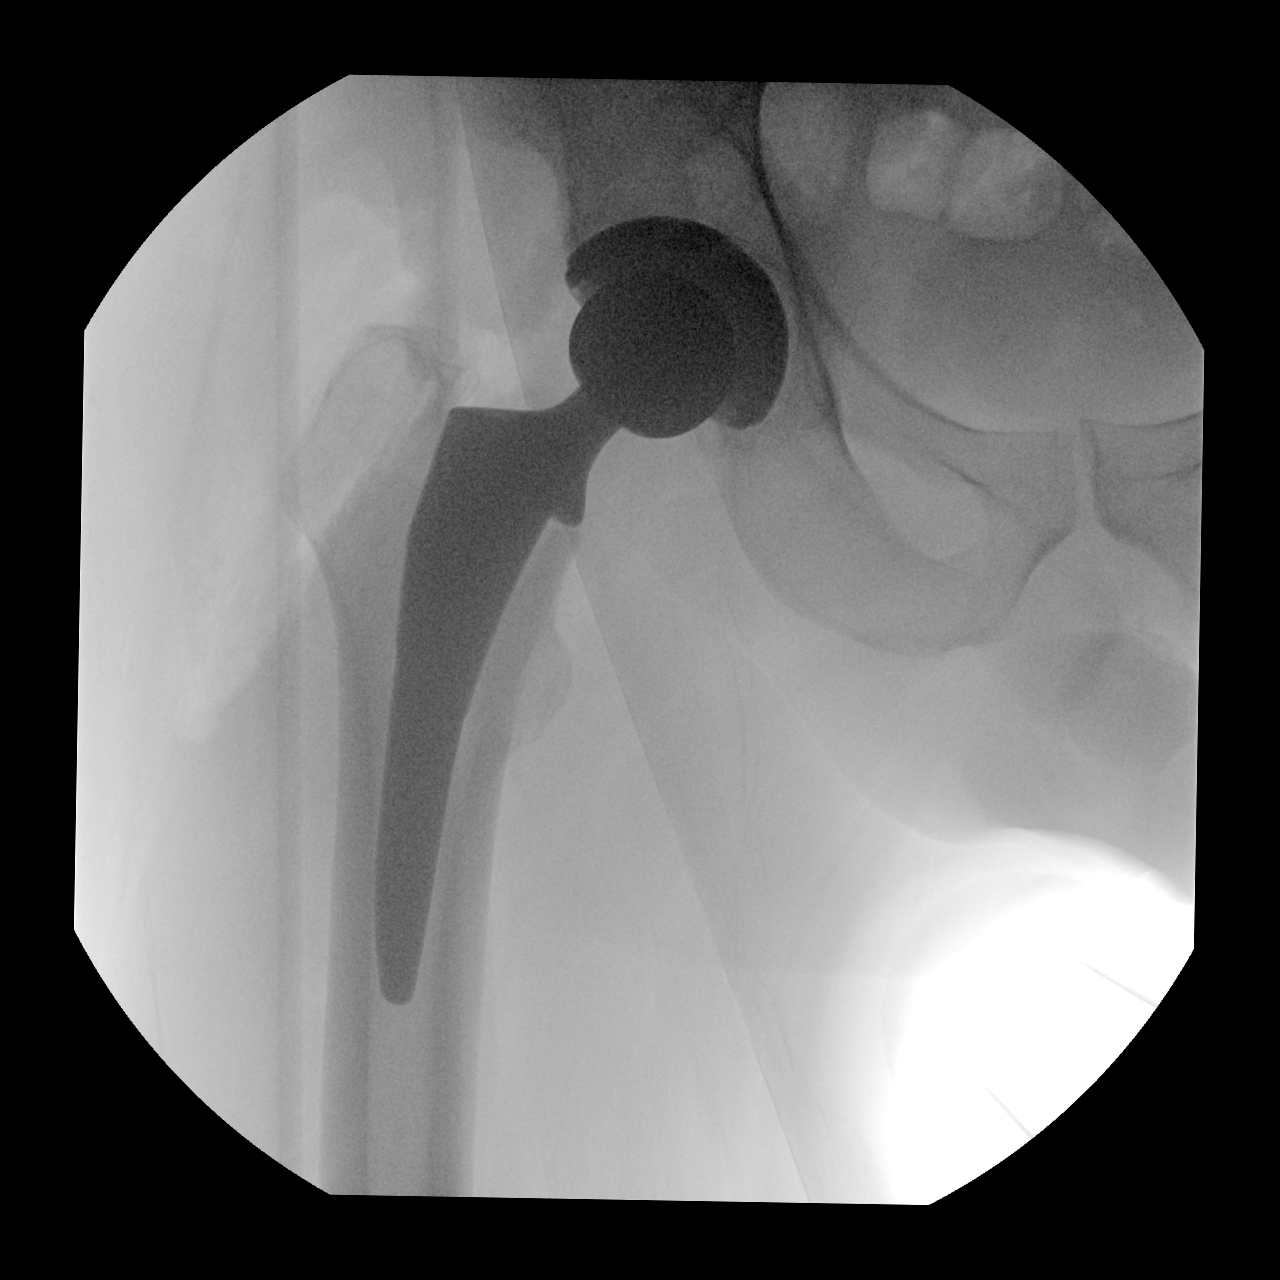

[3 of 3 positions shown; findings below may reference images not displayed]

FINDINGS: Three intraoperative fluoroscopic images were obtained of the right
hip. These demonstrate the right acetabular and femoral components
to be well situated. Expected postoperative changes are noted in the
surrounding soft tissues.
IMPRESSION: Status post right total hip arthroplasty.

## 2020-06-25 DIAGNOSIS — Z0000 Encounter for general adult medical examination without abnormal findings: Secondary | ICD-10-CM | POA: Diagnosis not present

## 2020-06-25 DIAGNOSIS — Z125 Encounter for screening for malignant neoplasm of prostate: Secondary | ICD-10-CM | POA: Diagnosis not present

## 2020-06-25 DIAGNOSIS — R69 Illness, unspecified: Secondary | ICD-10-CM | POA: Diagnosis not present

## 2020-06-25 DIAGNOSIS — E785 Hyperlipidemia, unspecified: Secondary | ICD-10-CM | POA: Diagnosis not present

## 2020-06-25 DIAGNOSIS — M109 Gout, unspecified: Secondary | ICD-10-CM | POA: Diagnosis not present

## 2020-06-25 DIAGNOSIS — R3911 Hesitancy of micturition: Secondary | ICD-10-CM | POA: Diagnosis not present

## 2020-06-25 DIAGNOSIS — N529 Male erectile dysfunction, unspecified: Secondary | ICD-10-CM | POA: Diagnosis not present

## 2020-06-25 DIAGNOSIS — I10 Essential (primary) hypertension: Secondary | ICD-10-CM | POA: Diagnosis not present

## 2020-07-01 DIAGNOSIS — I10 Essential (primary) hypertension: Secondary | ICD-10-CM | POA: Diagnosis not present

## 2020-07-01 DIAGNOSIS — Z125 Encounter for screening for malignant neoplasm of prostate: Secondary | ICD-10-CM | POA: Diagnosis not present

## 2020-07-01 DIAGNOSIS — M109 Gout, unspecified: Secondary | ICD-10-CM | POA: Diagnosis not present

## 2020-07-01 DIAGNOSIS — R69 Illness, unspecified: Secondary | ICD-10-CM | POA: Diagnosis not present

## 2020-07-01 DIAGNOSIS — E785 Hyperlipidemia, unspecified: Secondary | ICD-10-CM | POA: Diagnosis not present

## 2020-07-01 DIAGNOSIS — R3911 Hesitancy of micturition: Secondary | ICD-10-CM | POA: Diagnosis not present

## 2020-07-01 DIAGNOSIS — N529 Male erectile dysfunction, unspecified: Secondary | ICD-10-CM | POA: Diagnosis not present

## 2020-08-22 DIAGNOSIS — H5203 Hypermetropia, bilateral: Secondary | ICD-10-CM | POA: Diagnosis not present

## 2020-08-22 DIAGNOSIS — H40033 Anatomical narrow angle, bilateral: Secondary | ICD-10-CM | POA: Diagnosis not present

## 2020-08-22 DIAGNOSIS — H2513 Age-related nuclear cataract, bilateral: Secondary | ICD-10-CM | POA: Diagnosis not present

## 2020-12-02 DIAGNOSIS — Z23 Encounter for immunization: Secondary | ICD-10-CM | POA: Diagnosis not present

## 2020-12-02 DIAGNOSIS — M109 Gout, unspecified: Secondary | ICD-10-CM | POA: Diagnosis not present

## 2020-12-02 DIAGNOSIS — G4733 Obstructive sleep apnea (adult) (pediatric): Secondary | ICD-10-CM | POA: Diagnosis not present

## 2020-12-02 DIAGNOSIS — E785 Hyperlipidemia, unspecified: Secondary | ICD-10-CM | POA: Diagnosis not present

## 2020-12-02 DIAGNOSIS — R7301 Impaired fasting glucose: Secondary | ICD-10-CM | POA: Diagnosis not present

## 2020-12-02 DIAGNOSIS — R439 Unspecified disturbances of smell and taste: Secondary | ICD-10-CM | POA: Diagnosis not present

## 2021-03-12 DIAGNOSIS — G4733 Obstructive sleep apnea (adult) (pediatric): Secondary | ICD-10-CM | POA: Diagnosis not present

## 2021-04-03 DIAGNOSIS — G4733 Obstructive sleep apnea (adult) (pediatric): Secondary | ICD-10-CM | POA: Diagnosis not present

## 2021-04-12 DIAGNOSIS — G4733 Obstructive sleep apnea (adult) (pediatric): Secondary | ICD-10-CM | POA: Diagnosis not present

## 2021-04-28 DIAGNOSIS — R0789 Other chest pain: Secondary | ICD-10-CM | POA: Diagnosis not present

## 2021-04-28 DIAGNOSIS — S61011A Laceration without foreign body of right thumb without damage to nail, initial encounter: Secondary | ICD-10-CM | POA: Diagnosis not present

## 2021-04-28 DIAGNOSIS — S20219A Contusion of unspecified front wall of thorax, initial encounter: Secondary | ICD-10-CM | POA: Diagnosis not present

## 2021-04-29 DIAGNOSIS — G4733 Obstructive sleep apnea (adult) (pediatric): Secondary | ICD-10-CM | POA: Diagnosis not present

## 2021-05-13 DIAGNOSIS — G4733 Obstructive sleep apnea (adult) (pediatric): Secondary | ICD-10-CM | POA: Diagnosis not present

## 2021-05-22 DIAGNOSIS — M25562 Pain in left knee: Secondary | ICD-10-CM | POA: Diagnosis not present

## 2021-05-22 DIAGNOSIS — M1712 Unilateral primary osteoarthritis, left knee: Secondary | ICD-10-CM | POA: Diagnosis not present

## 2021-06-05 DIAGNOSIS — Z5181 Encounter for therapeutic drug level monitoring: Secondary | ICD-10-CM | POA: Diagnosis not present

## 2021-06-05 DIAGNOSIS — G43109 Migraine with aura, not intractable, without status migrainosus: Secondary | ICD-10-CM | POA: Diagnosis not present

## 2021-06-05 DIAGNOSIS — M25569 Pain in unspecified knee: Secondary | ICD-10-CM | POA: Diagnosis not present

## 2021-06-05 DIAGNOSIS — M109 Gout, unspecified: Secondary | ICD-10-CM | POA: Diagnosis not present

## 2021-06-05 DIAGNOSIS — R69 Illness, unspecified: Secondary | ICD-10-CM | POA: Diagnosis not present

## 2021-06-05 DIAGNOSIS — E785 Hyperlipidemia, unspecified: Secondary | ICD-10-CM | POA: Diagnosis not present

## 2021-06-05 DIAGNOSIS — R7301 Impaired fasting glucose: Secondary | ICD-10-CM | POA: Diagnosis not present

## 2021-06-05 DIAGNOSIS — Z01818 Encounter for other preprocedural examination: Secondary | ICD-10-CM | POA: Diagnosis not present

## 2021-07-03 DIAGNOSIS — R3911 Hesitancy of micturition: Secondary | ICD-10-CM | POA: Diagnosis not present

## 2021-07-03 DIAGNOSIS — Z1211 Encounter for screening for malignant neoplasm of colon: Secondary | ICD-10-CM | POA: Diagnosis not present

## 2021-07-03 DIAGNOSIS — R7301 Impaired fasting glucose: Secondary | ICD-10-CM | POA: Diagnosis not present

## 2021-07-03 DIAGNOSIS — I10 Essential (primary) hypertension: Secondary | ICD-10-CM | POA: Diagnosis not present

## 2021-07-03 DIAGNOSIS — E785 Hyperlipidemia, unspecified: Secondary | ICD-10-CM | POA: Diagnosis not present

## 2021-07-03 DIAGNOSIS — Z0000 Encounter for general adult medical examination without abnormal findings: Secondary | ICD-10-CM | POA: Diagnosis not present

## 2021-07-21 DIAGNOSIS — R400 Somnolence: Secondary | ICD-10-CM | POA: Diagnosis not present

## 2021-07-21 DIAGNOSIS — G4733 Obstructive sleep apnea (adult) (pediatric): Secondary | ICD-10-CM | POA: Diagnosis not present

## 2021-07-21 DIAGNOSIS — R269 Unspecified abnormalities of gait and mobility: Secondary | ICD-10-CM | POA: Diagnosis not present

## 2021-08-21 DIAGNOSIS — R400 Somnolence: Secondary | ICD-10-CM | POA: Diagnosis not present

## 2021-08-21 DIAGNOSIS — G4733 Obstructive sleep apnea (adult) (pediatric): Secondary | ICD-10-CM | POA: Diagnosis not present

## 2021-08-21 DIAGNOSIS — R269 Unspecified abnormalities of gait and mobility: Secondary | ICD-10-CM | POA: Diagnosis not present

## 2021-09-15 NOTE — H&P (Signed)
TOTAL KNEE ADMISSION H&P  Patient is being admitted for left total knee arthroplasty.  Subjective:  Chief Complaint: Left knee pain.  HPI: Craig Rice, 76 y.o. male has a history of pain and functional disability in the left knee due to arthritis and has failed non-surgical conservative treatments for greater than 12 weeks to include corticosteriod injections, viscosupplementation injections, and activity modification. Onset of symptoms was  gradual , starting  several  years ago with gradually worsening course since that time. The patient noted no past surgery on the left knee.  Patient currently rates pain in the left knee at 7 out of 10 with activity. Patient has night pain, worsening of pain with activity and weight bearing, pain with passive range of motion, and crepitus. Patient has evidence of  bone-on-bone arthritis in the medial compartment and near bone-on-bone arthritis in the patellofemoral compartment  by imaging studies. There is no active infection.  Patient Active Problem List   Diagnosis Date Noted   OA (osteoarthritis) of hip 07/05/2019   Osteoarthritis of hip 07/05/2019   Postop Hyponatremia 05/25/2012   Postoperative anemia due to acute blood loss 05/24/2012   OA (osteoarthritis) of knee 05/23/2012    Past Medical History:  Diagnosis Date   Arthritis    osteoarthritis -knee   Depression    GERD (gastroesophageal reflux disease)    H/O hiatal hernia    Headache(784.0)    tx. migraines-"Metoprolol"   Pre-diabetes    manage with diet.    Past Surgical History:  Procedure Laterality Date   BACK SURGERY     lumbar laminectomy   KNEE ARTHROSCOPY     bil. knee scopes   MASS EXCISION     chest"benign"   TOTAL HIP ARTHROPLASTY Right 07/05/2019   Procedure: TOTAL HIP ARTHROPLASTY ANTERIOR APPROACH;  Surgeon: Gaynelle Arabian, MD;  Location: [facility];  Service: Orthopedics;  Laterality: Right;  162mn   TOTAL KNEE ARTHROPLASTY Right 05/23/2012   Procedure: TOTAL KNEE  ARTHROPLASTY;  Surgeon: FGearlean Alf MD;  Location: [facility];  Service: Orthopedics;  Laterality: Right;   VASECTOMY      Prior to Admission medications   Medication Sig Start Date End Date Taking? Authorizing Provider  acetaminophen (TYLENOL) 650 MG CR tablet Take 1,300 mg by mouth in the morning and at bedtime.    [provider]  allopurinol (ZYLOPRIM) 100 MG tablet Take 200 mg by mouth daily.    [provider]  cetirizine (ZYRTEC) 10 MG tablet Take 5-10 mg by mouth daily as needed for allergies.    [provider]  HYDROmorphone (DILAUDID) 2 MG tablet Take 1-2 tablets (2-4 mg total) by mouth every 6 (six) hours as needed for moderate pain or severe pain. 07/06/19   Jamisen Duerson, KOk Anis PA  methocarbamol (ROBAXIN) 500 MG tablet Take 1 tablet (500 mg total) by mouth every 6 (six) hours as needed for muscle spasms. 07/06/19   Mukesh Kornegay, KOk Anis PA  metoprolol (LOPRESSOR) 50 MG tablet Take 50 mg by mouth daily before breakfast.    [provider]  omeprazole (PRILOSEC OTC) 20 MG tablet Take 10-20 mg by mouth daily as needed (acid reflux).     [provider]  PARoxetine (PAXIL) 20 MG tablet Take 20 mg by mouth daily before breakfast.    [provider]    Allergies  Allergen Reactions   Morphine And Related Itching    Social History   Socioeconomic History   Marital status: Married  Spouse name: Not on file   Number of children: Not on file   Years of education: Not on file   Highest education level: Not on file  Occupational History   Not on file  Tobacco Use   Smoking status: Former    Types: Cigarettes    Quit date: 05/16/1980    Years since quitting: 41.3   Smokeless tobacco: Never  Vaping Use   Vaping Use: Never used  Substance and Sexual Activity   Alcohol use: Yes    Alcohol/week: 5.0 standard drinks of alcohol    Types: 5 Shots of liquor per week    Comment: x 5 drinks per week   Drug use: No   Sexual  activity: Yes  Other Topics Concern   Not on file  Social History Narrative   Not on file   Social Determinants of Health   Financial Resource Strain: Not on file  Food Insecurity: Not on file  Transportation Needs: Not on file  Physical Activity: Not on file  Stress: Not on file  Social Connections: Not on file  Intimate Partner Violence: Not on file    Tobacco Use: Medium Risk (07/06/2019)   Patient History    Smoking Tobacco Use: Former    Smokeless Tobacco Use: Never    Passive Exposure: Not on file   Social History   Substance and Sexual Activity  Alcohol Use Yes   Alcohol/week: 5.0 standard drinks of alcohol   Types: 5 Shots of liquor per week   Comment: x 5 drinks per week    Family History  Problem Relation Age of Onset   Aortic aneurysm Father     Review of Systems  Constitutional:  Negative for chills and fever.  HENT:  Negative for congestion, sore throat and tinnitus.   Eyes:  Negative for double vision, photophobia and pain.  Respiratory:  Negative for cough, shortness of breath and wheezing.   Cardiovascular:  Negative for chest pain, palpitations and orthopnea.  Gastrointestinal:  Negative for heartburn, nausea and vomiting.  Genitourinary:  Negative for dysuria, frequency and urgency.  Musculoskeletal:  Positive for joint pain.  Neurological:  Negative for dizziness, weakness and headaches.    Objective:  Physical Exam: Well nourished and well developed.  General: Alert and oriented x3, cooperative and pleasant, no acute distress.  Head: normocephalic, atraumatic, neck supple.  Eyes: EOMI.  Musculoskeletal:  Left Knee Exam:  Varus deformity.  No effusion present. No swelling present.  Range of motion: 5 to 130 degrees.  Positive crepitus on range of motion of the knee.  Positive medial joint line tenderness.  No lateral joint line tenderness.  The knee is stable.   Calves soft and nontender. Motor function intact in LE. Strength 5/5 LE  bilaterally. Neuro: Distal pulses 2+. Sensation to light touch intact in LE.  Imaging Review Plain radiographs demonstrate severe degenerative joint disease of the left knee. The overall alignment is significant varus. The bone quality appears to be adequate for age and reported activity level.  Assessment/Plan:  End stage arthritis, left knee   The patient history, physical examination, clinical judgment of the provider and imaging studies are consistent with end stage degenerative joint disease of the left knee and total knee arthroplasty is deemed medically necessary. The treatment options including medical management, injection therapy arthroscopy and arthroplasty were discussed at length. The risks and benefits of total knee arthroplasty were presented and reviewed. The risks due to aseptic loosening, infection, stiffness, patella tracking problems,  thromboembolic complications and other imponderables were discussed. The patient acknowledged the explanation, agreed to proceed with the plan and consent was signed. Patient is being admitted for inpatient treatment for surgery, pain control, PT, OT, prophylactic antibiotics, VTE prophylaxis, progressive ambulation and ADLs and discharge planning. The patient is planning to be discharged  home .   Patient's anticipated LOS is less than 2 midnights, meeting these requirements: - Lives within 1 hour of care - Has a competent adult at home to recover with post-op recover - NO history of  - Chronic pain requiring opioids  - Diabetes  - Coronary Artery Disease  - Heart failure  - Heart attack  - Stroke  - DVT/VTE  - Cardiac arrhythmia  - Respiratory Failure/COPD  - Renal failure  - Anemia  - Advanced Liver disease  Therapy Plans: Outpatient therapy at [facility] Disposition: Home with wife Planned DVT Prophylaxis: Aspirin 325 mg BID DME Needed: None PCP: London Pepper, MD (clearance received) TXA: IV Allergies: Morphine  (itching) Anesthesia Concerns: None BMI: 37.6 Last HgbA1c: Not diabetic.  Pharmacy: [facility]  - Patient was instructed on what medications to stop prior to surgery. - Follow-up visit in 2 weeks with Dr. Wynelle Link - Begin physical therapy following surgery - Pre-operative lab work as pre-surgical testing - Prescriptions will be provided in hospital at time of discharge  Theresa Duty, PA-C Orthopedic Surgery [facility] [place]

## 2021-09-20 DIAGNOSIS — G4733 Obstructive sleep apnea (adult) (pediatric): Secondary | ICD-10-CM | POA: Diagnosis not present

## 2021-09-20 DIAGNOSIS — R400 Somnolence: Secondary | ICD-10-CM | POA: Diagnosis not present

## 2021-09-20 DIAGNOSIS — R269 Unspecified abnormalities of gait and mobility: Secondary | ICD-10-CM | POA: Diagnosis not present

## 2021-09-22 NOTE — Patient Instructions (Signed)
DUE TO COVID-19 ONLY TWO VISITORS  (aged 76 and older)  ARE ALLOWED TO COME WITH YOU AND STAY IN THE WAITING ROOM ONLY DURING PRE OP AND PROCEDURE.   **NO VISITORS ARE ALLOWED IN THE SHORT STAY AREA OR RECOVERY ROOM!!**  IF YOU WILL BE ADMITTED INTO THE HOSPITAL YOU ARE ALLOWED ONLY FOUR SUPPORT PEOPLE DURING VISITATION HOURS ONLY (7 AM -8PM)   The support person(s) must pass our screening, gel in and out, and wear a mask at all times, including in the patient's room. Patients must also wear a mask when staff or their support person are in the room. Visitors GUEST BADGE MUST BE WORN VISIBLY  One adult visitor may remain with you overnight and MUST be in the room by 8 P.M.     Your procedure is scheduled on: 10/06/21   Report to [facility] Main Entrance    Report to admitting at 6:50 AM   Call this number if you have problems the morning of surgery [phone]   Do not eat food :After Midnight.   After Midnight you may have the following liquids until _6:00____ AM/ DAY OF SURGERY  Water Black Coffee (sugar ok, NO MILK/CREAM OR CREAMERS)  Tea (sugar ok, NO MILK/CREAM OR CREAMERS) regular and decaf                             Plain Jell-O (NO RED)                                           Fruit ices (not with fruit pulp, NO RED)                                     Popsicles (NO RED)                                                                  Juice: apple, WHITE grape, WHITE cranberry Sports drinks like Gatorade (NO RED) Clear broth(vegetable,chicken,beef)     The day of surgery Drink ONE  G2 at  5:45 AM the morning of surgery. Drink in one sitting. Do not sip.  This drink was given to you during your hospital  pre-op appointment visit. Nothing else to drink after completing the   G2. At 6:00AM          If you have questions, please contact your surgeon's office.     Oral Hygiene is also important to reduce your risk of infection.                                     Remember - BRUSH YOUR TEETH THE MORNING OF SURGERY WITH YOUR REGULAR TOOTHPASTE    Take these medicines the morning of surgery with A SIP OF WATER: Paxil, Metoprolol, Allopurinol, Omeprazole  Bring mask and tibing.  You may not have any metal on your body including jewelry, and body piercing             Do not wear lotions, powders, perfumes/cologne, or deodorant                Men may shave face and neck.   Do not bring valuables to the hospital. [place].   Contacts, dentures or bridgework may not be worn into surgery.   Bring small overnight bag day of surgery.   DO NOT [place]. PHARMACY WILL DISPENSE MEDICATIONS LISTED ON YOUR MEDICATION LIST TO YOU DURING YOUR ADMISSION [place]!       Special Instructions: Bring a copy of your healthcare power of attorney and living will documents the day of surgery if you haven't scanned them before.              Please read over the following fact sheets you were given: IF YOU HAVE QUESTIONS ABOUT YOUR PRE-OP INSTRUCTIONS PLEASE CALL [phone]     [facility] - Preparing for Surgery Before surgery, you can play an important role.  Because skin is not sterile, your skin needs to be as free of germs as possible.  You can reduce the number of germs on your skin by washing with CHG (chlorahexidine gluconate) soap before surgery.  CHG is an antiseptic cleaner which kills germs and bonds with the skin to continue killing germs even after washing. Please DO NOT use if you have an allergy to CHG or antibacterial soaps.  If your skin becomes reddened/irritated stop using the CHG and inform your nurse when you arrive at Short Stay. You may shave your face/neck. Please follow these instructions carefully:  1.  Shower with CHG Soap the night before surgery and the  morning of Surgery.  2.  If you choose to wash your hair,  wash your hair first as usual with your  normal  shampoo.  3.  After you shampoo, rinse your hair and body thoroughly to remove the  shampoo.                            4.  Use CHG as you would any other liquid soap.  You can apply chg directly  to the skin and wash                       Gently with a scrungie or clean washcloth.  5.  Apply the CHG Soap to your body ONLY FROM THE NECK DOWN.   Do not use on face/ open                           Wound or open sores. Avoid contact with eyes, ears mouth and genitals (private parts).                       Wash face,  Genitals (private parts) with your normal soap.             6.  Wash thoroughly, paying special attention to the area where your surgery  will be performed.  7.  Thoroughly rinse your body with warm water from the neck down.  8.  DO NOT shower/wash with  your normal soap after using and rinsing off  the CHG Soap.                9.  Pat yourself dry with a clean towel.            10.  Wear clean pajamas.            11.  Place clean sheets on your bed the night of your first shower and do not  sleep with pets. Day of Surgery : Do not apply any lotions/deodorants the morning of surgery.  Please wear clean clothes to the hospital/surgery center.  FAILURE TO FOLLOW THESE INSTRUCTIONS MAY RESULT IN THE CANCELLATION OF YOUR SURGERY  ________________________________________________________________________   Incentive Spirometer  An incentive spirometer is a tool that can help keep your lungs clear and active. This tool measures how well you are filling your lungs with each breath. Taking long deep breaths may help reverse or decrease the chance of developing breathing (pulmonary) problems (especially infection) following: A long period of time when you are unable to move or be active. BEFORE THE PROCEDURE  If the spirometer includes an indicator to show your best effort, your nurse or respiratory therapist will set it to a desired goal. If  possible, sit up straight or lean slightly forward. Try not to slouch. Hold the incentive spirometer in an upright position. INSTRUCTIONS FOR USE  Sit on the edge of your bed if possible, or sit up as far as you can in bed or on a chair. Hold the incentive spirometer in an upright position. Breathe out normally. Place the mouthpiece in your mouth and seal your lips tightly around it. Breathe in slowly and as deeply as possible, raising the piston or the ball toward the top of the column. Hold your breath for 3-5 seconds or for as long as possible. Allow the piston or ball to fall to the bottom of the column. Remove the mouthpiece from your mouth and breathe out normally. Rest for a few seconds and repeat Steps 1 through 7 at least 10 times every 1-2 hours when you are awake. Take your time and take a few normal breaths between deep breaths. The spirometer may include an indicator to show your best effort. Use the indicator as a goal to work toward during each repetition. After each set of 10 deep breaths, practice coughing to be sure your lungs are clear. If you have an incision (the cut made at the time of surgery), support your incision when coughing by placing a pillow or rolled up towels firmly against it. Once you are able to get out of bed, walk around indoors and cough well. You may stop using the incentive spirometer when instructed by your caregiver.  RISKS AND COMPLICATIONS Take your time so you do not get dizzy or light-headed. If you are in pain, you may need to take or ask for pain medication before doing incentive spirometry. It is harder to take a deep breath if you are having pain. AFTER USE Rest and breathe slowly and easily. It can be helpful to keep track of a log of your progress. Your caregiver can provide you with a simple table to help with this. If you are using the spirometer at home, follow these instructions: [place] IF:  You are having difficultly using the  spirometer. You have trouble using the spirometer as often as instructed. Your pain medication is not giving enough relief while using the spirometer. You  develop fever of 100.5 F (38.1 C) or higher. SEEK IMMEDIATE MEDICAL CARE IF:  You cough up bloody sputum that had not been present before. You develop fever of 102 F (38.9 C) or greater. You develop worsening pain at or near the incision site. MAKE SURE YOU:  Understand these instructions. Will watch your condition. Will get help right away if you are not doing well or get worse. Document Released: 07/13/2006 Document Revised: 05/25/2011 Document Reviewed: 09/13/2006 [facility] Patient Information 2014 [place], [place].   ________________________________________________________________________

## 2021-09-25 ENCOUNTER — Encounter (HOSPITAL_COMMUNITY): Payer: Self-pay

## 2021-09-25 ENCOUNTER — Other Ambulatory Visit: Payer: Self-pay

## 2021-09-25 ENCOUNTER — Encounter (HOSPITAL_COMMUNITY)
Admission: RE | Admit: 2021-09-25 | Discharge: 2021-09-25 | Disposition: A | Discharge status: Home / Self Care | Payer: Medicare HMO | Source: Ambulatory Visit | Attending: Orthopedic Surgery | Admitting: Orthopedic Surgery

## 2021-09-25 DIAGNOSIS — R7303 Prediabetes: Secondary | ICD-10-CM | POA: Insufficient documentation

## 2021-09-25 DIAGNOSIS — Z01818 Encounter for other preprocedural examination: Secondary | ICD-10-CM | POA: Insufficient documentation

## 2021-09-25 HISTORY — DX: Sleep apnea, unspecified: G47.30

## 2021-09-25 LAB — CBC
HCT: 42.5 % (ref 39.0–52.0)
Hemoglobin: 14.4 g/dL (ref 13.0–17.0)
MCH: 30.8 pg (ref 26.0–34.0)
MCHC: 33.9 g/dL (ref 30.0–36.0)
MCV: 90.8 fL (ref 80.0–100.0)
Platelets: 164 10*3/uL (ref 150–400)
RBC: 4.68 MIL/uL (ref 4.22–5.81)
RDW: 12.5 % (ref 11.5–15.5)
WBC: 6.2 10*3/uL (ref 4.0–10.5)
nRBC: 0 % (ref 0.0–0.2)

## 2021-09-25 LAB — BASIC METABOLIC PANEL
Anion gap: 7 (ref 5–15)
BUN: 18 mg/dL (ref 8–23)
CO2: 23 mmol/L (ref 22–32)
Calcium: 9.4 mg/dL (ref 8.9–10.3)
Chloride: 108 mmol/L (ref 98–111)
Creatinine, Ser: 0.99 mg/dL (ref 0.61–1.24)
GFR, Estimated: >60 mL/min (ref >60)
Glucose, Bld: 108 mg/dL — ABNORMAL HIGH (ref 70–99)
Potassium: 4.4 mmol/L (ref 3.5–5.1)
Sodium: 138 mmol/L (ref 135–145)

## 2021-09-25 LAB — GLUCOSE, CAPILLARY: Glucose-Capillary: 115 mg/dL — ABNORMAL HIGH (ref 70–99)

## 2021-09-25 LAB — SURGICAL PCR SCREEN
MRSA, PCR: POSITIVE — AB
Staphylococcus aureus: POSITIVE — AB

## 2021-09-25 LAB — HEMOGLOBIN A1C
Hgb A1c MFr Bld: 5.6 % (ref 4.8–5.6)
Mean Plasma Glucose: 114.02 mg/dL

## 2021-09-25 NOTE — Progress Notes (Signed)
Anesthesia note:  Bowel prep reminder:na  PCP - Dr. Leia Alf Cardiologist -none Other-   Chest x-ray - no EKG - requested from Dr. Darien Ramus office Stress Test - no ECHO - no Cardiac Cath - NA  Pacemaker/ICD device last checked:NA  Sleep Study - yes CPAP - yes  Pt is pre diabetic-yes Fasting Blood Sugar -  Checks Blood Sugar _____  Blood Thinner:NA Blood Thinner Instructions: Aspirin Instructions: Last Dose:  Anesthesia review: no  Patient denies shortness of breath, fever, cough and chest pain at PAT appointment Pt has no SOB with activities. He takes metoprolol for migraine prevention  Patient verbalized understanding of instructions that were given to them at the PAT appointment. Patient was also instructed that they will need to review over the PAT instructions again at home before surgery. yes

## 2021-09-29 DIAGNOSIS — G4733 Obstructive sleep apnea (adult) (pediatric): Secondary | ICD-10-CM | POA: Diagnosis not present

## 2021-10-06 ENCOUNTER — Other Ambulatory Visit: Payer: Self-pay

## 2021-10-06 ENCOUNTER — Ambulatory Visit (HOSPITAL_COMMUNITY): Payer: Medicare HMO | Admitting: Anesthesiology

## 2021-10-06 ENCOUNTER — Encounter (HOSPITAL_COMMUNITY)
Admission: RE | Disposition: A | Discharge status: Home / Self Care | Payer: Self-pay | Source: Home / Self Care | Attending: Orthopedic Surgery

## 2021-10-06 ENCOUNTER — Observation Stay (HOSPITAL_COMMUNITY)
Admission: RE | Admit: 2021-10-06 | Discharge: 2021-10-07 | Disposition: A | Discharge status: Home / Self Care | Payer: Medicare HMO | Attending: Orthopedic Surgery | Admitting: Orthopedic Surgery

## 2021-10-06 ENCOUNTER — Ambulatory Visit (HOSPITAL_BASED_OUTPATIENT_CLINIC_OR_DEPARTMENT_OTHER): Payer: Medicare HMO | Admitting: Anesthesiology

## 2021-10-06 ENCOUNTER — Encounter (HOSPITAL_COMMUNITY): Payer: Self-pay | Admitting: Orthopedic Surgery

## 2021-10-06 DIAGNOSIS — R7303 Prediabetes: Secondary | ICD-10-CM

## 2021-10-06 DIAGNOSIS — Z79899 Other long term (current) drug therapy: Secondary | ICD-10-CM | POA: Diagnosis not present

## 2021-10-06 DIAGNOSIS — Z96651 Presence of right artificial knee joint: Secondary | ICD-10-CM | POA: Diagnosis not present

## 2021-10-06 DIAGNOSIS — Z96641 Presence of right artificial hip joint: Secondary | ICD-10-CM | POA: Diagnosis not present

## 2021-10-06 DIAGNOSIS — M25562 Pain in left knee: Secondary | ICD-10-CM | POA: Insufficient documentation

## 2021-10-06 DIAGNOSIS — G8918 Other acute postprocedural pain: Secondary | ICD-10-CM | POA: Diagnosis not present

## 2021-10-06 DIAGNOSIS — Z87891 Personal history of nicotine dependence: Secondary | ICD-10-CM | POA: Diagnosis not present

## 2021-10-06 DIAGNOSIS — M1712 Unilateral primary osteoarthritis, left knee: Secondary | ICD-10-CM

## 2021-10-06 DIAGNOSIS — Z01818 Encounter for other preprocedural examination: Secondary | ICD-10-CM

## 2021-10-06 HISTORY — PX: TOTAL KNEE ARTHROPLASTY: SHX125

## 2021-10-06 SURGERY — ARTHROPLASTY, KNEE, TOTAL
Anesthesia: Spinal | Site: Knee | Laterality: Left

## 2021-10-06 MED ORDER — ORAL CARE MOUTH RINSE: 15.0000 mL | Freq: Once | OROMUCOSAL | Status: AC

## 2021-10-06 MED ORDER — MIDAZOLAM HCL 2 MG/2ML IJ SOLN
1.0000 mg | Freq: Once | INTRAMUSCULAR | Status: AC
  Administered 2021-10-06: 1 mg via INTRAVENOUS
  Filled 2021-10-06: qty 2

## 2021-10-06 MED ORDER — ONDANSETRON HCL 4 MG/2ML IJ SOLN: 4.0000 mg | Freq: Four times a day (QID) | INTRAMUSCULAR | Status: DC | PRN

## 2021-10-06 MED ORDER — ROPIVACAINE HCL 5 MG/ML IJ SOLN
INTRAMUSCULAR | Status: DC | PRN
  Administered 2021-10-06: 30 mL via PERINEURAL

## 2021-10-06 MED ORDER — ONDANSETRON HCL 4 MG/2ML IJ SOLN
INTRAMUSCULAR | Status: DC | PRN
  Administered 2021-10-06: 4 mg via INTRAVENOUS

## 2021-10-06 MED ORDER — 0.9 % SODIUM CHLORIDE (POUR BTL) OPTIME
TOPICAL | Status: DC | PRN
  Administered 2021-10-06: 1000 mL

## 2021-10-06 MED ORDER — DEXAMETHASONE SODIUM PHOSPHATE 10 MG/ML IJ SOLN
10.0000 mg | Freq: Once | INTRAMUSCULAR | Status: AC
  Administered 2021-10-07: 10 mg via INTRAVENOUS
  Filled 2021-10-06: qty 1

## 2021-10-06 MED ORDER — DEXAMETHASONE SODIUM PHOSPHATE 10 MG/ML IJ SOLN
8.0000 mg | Freq: Once | INTRAMUSCULAR | Status: AC
  Administered 2021-10-06: 8 mg via INTRAVENOUS

## 2021-10-06 MED ORDER — MIDAZOLAM HCL 2 MG/2ML IJ SOLN
INTRAMUSCULAR | Status: AC
  Filled 2021-10-06: qty 2

## 2021-10-06 MED ORDER — AMISULPRIDE (ANTIEMETIC) 5 MG/2ML IV SOLN: 10.0000 mg | Freq: Once | INTRAVENOUS | Status: DC | PRN

## 2021-10-06 MED ORDER — BISACODYL 10 MG RE SUPP: 10.0000 mg | Freq: Every day | RECTAL | Status: DC | PRN

## 2021-10-06 MED ORDER — METHOCARBAMOL 500 MG PO TABS
500.0000 mg | ORAL_TABLET | Freq: Four times a day (QID) | ORAL | Status: DC | PRN
  Administered 2021-10-06 – 2021-10-07 (×2): 500 mg via ORAL
  Filled 2021-10-06 (×2): qty 1

## 2021-10-06 MED ORDER — BUPIVACAINE IN DEXTROSE 0.75-8.25 % IT SOLN
INTRATHECAL | Status: DC | PRN
  Administered 2021-10-06: 1.6 mL via INTRATHECAL

## 2021-10-06 MED ORDER — SODIUM CHLORIDE 0.9 % IV SOLN: INTRAVENOUS | Status: DC

## 2021-10-06 MED ORDER — POVIDONE-IODINE 10 % EX SWAB: 2.0000 "application " | Freq: Once | CUTANEOUS | Status: DC

## 2021-10-06 MED ORDER — LACTATED RINGERS IV SOLN: INTRAVENOUS | Status: DC

## 2021-10-06 MED ORDER — KETOROLAC TROMETHAMINE 30 MG/ML IJ SOLN
INTRAMUSCULAR | Status: DC | PRN
  Administered 2021-10-06: 15 mg via INTRAVENOUS

## 2021-10-06 MED ORDER — ONDANSETRON HCL 4 MG PO TABS: 4.0000 mg | ORAL_TABLET | Freq: Four times a day (QID) | ORAL | Status: DC | PRN

## 2021-10-06 MED ORDER — BUPIVACAINE LIPOSOME 1.3 % IJ SUSP
INTRAMUSCULAR | Status: AC
  Filled 2021-10-06: qty 20

## 2021-10-06 MED ORDER — OXYCODONE HCL 5 MG PO TABS: 5.0000 mg | ORAL_TABLET | Freq: Once | ORAL | Status: DC | PRN

## 2021-10-06 MED ORDER — OXYCODONE HCL 5 MG/5ML PO SOLN: 5.0000 mg | Freq: Once | ORAL | Status: DC | PRN

## 2021-10-06 MED ORDER — ACETAMINOPHEN 500 MG PO TABS: 1000.0000 mg | ORAL_TABLET | Freq: Once | ORAL | Status: DC

## 2021-10-06 MED ORDER — POLYETHYLENE GLYCOL 3350 17 G PO PACK
17.0000 g | PACK | Freq: Every day | ORAL | Status: DC | PRN
Start: 2021-10-06 — End: 2021-10-07

## 2021-10-06 MED ORDER — SODIUM CHLORIDE (PF) 0.9 % IJ SOLN
INTRAMUSCULAR | Status: AC
Start: 2021-10-06 — End: ?
  Filled 2021-10-06: qty 10

## 2021-10-06 MED ORDER — BUPIVACAINE LIPOSOME 1.3 % IJ SUSP
INTRAMUSCULAR | Status: DC | PRN
  Administered 2021-10-06: 20 mL

## 2021-10-06 MED ORDER — HYDROMORPHONE HCL 1 MG/ML IJ SOLN: 0.5000 mg | INTRAMUSCULAR | Status: DC | PRN

## 2021-10-06 MED ORDER — PROPOFOL 500 MG/50ML IV EMUL
INTRAVENOUS | Status: DC | PRN
  Administered 2021-10-06: 75 ug/kg/min via INTRAVENOUS

## 2021-10-06 MED ORDER — SODIUM CHLORIDE (PF) 0.9 % IJ SOLN
INTRAMUSCULAR | Status: DC | PRN
  Administered 2021-10-06: 60 mL

## 2021-10-06 MED ORDER — METOPROLOL TARTRATE 50 MG PO TABS
50.0000 mg | ORAL_TABLET | Freq: Every day | ORAL | Status: DC
  Administered 2021-10-07: 50 mg via ORAL
  Filled 2021-10-06: qty 1

## 2021-10-06 MED ORDER — STERILE WATER FOR IRRIGATION IR SOLN
Status: DC | PRN
  Administered 2021-10-06: 2000 mL

## 2021-10-06 MED ORDER — ALLOPURINOL 100 MG PO TABS
200.0000 mg | ORAL_TABLET | Freq: Every day | ORAL | Status: DC
  Administered 2021-10-07: 200 mg via ORAL
  Filled 2021-10-06: qty 2

## 2021-10-06 MED ORDER — ACETAMINOPHEN 10 MG/ML IV SOLN
1000.0000 mg | Freq: Four times a day (QID) | INTRAVENOUS | Status: DC
  Administered 2021-10-06: 1000 mg via INTRAVENOUS
  Filled 2021-10-06: qty 100

## 2021-10-06 MED ORDER — CEFAZOLIN SODIUM-DEXTROSE 2-4 GM/100ML-% IV SOLN
2.0000 g | Freq: Four times a day (QID) | INTRAVENOUS | Status: AC
  Administered 2021-10-06 (×2): 2 g via INTRAVENOUS
  Filled 2021-10-06 (×2): qty 100

## 2021-10-06 MED ORDER — POVIDONE-IODINE 10 % EX SWAB: Freq: Once | CUTANEOUS | Status: AC

## 2021-10-06 MED ORDER — ONDANSETRON HCL 4 MG/2ML IJ SOLN
INTRAMUSCULAR | Status: AC
  Filled 2021-10-06: qty 2

## 2021-10-06 MED ORDER — MENTHOL 3 MG MT LOZG
1.0000 | LOZENGE | OROMUCOSAL | Status: DC | PRN
Start: 2021-10-06 — End: 2021-10-07

## 2021-10-06 MED ORDER — SODIUM CHLORIDE (PF) 0.9 % IJ SOLN
INTRAMUSCULAR | Status: AC
  Filled 2021-10-06: qty 50

## 2021-10-06 MED ORDER — KETOROLAC TROMETHAMINE 30 MG/ML IJ SOLN
INTRAMUSCULAR | Status: AC
  Filled 2021-10-06: qty 1

## 2021-10-06 MED ORDER — ONDANSETRON HCL 4 MG/2ML IJ SOLN: 4.0000 mg | Freq: Once | INTRAMUSCULAR | Status: DC | PRN

## 2021-10-06 MED ORDER — CHLORHEXIDINE GLUCONATE 0.12 % MT SOLN
15.0000 mL | Freq: Once | OROMUCOSAL | Status: AC
  Administered 2021-10-06: 15 mL via OROMUCOSAL

## 2021-10-06 MED ORDER — LORATADINE 10 MG PO TABS: 10.0000 mg | ORAL_TABLET | Freq: Every day | ORAL | Status: DC | PRN

## 2021-10-06 MED ORDER — EZETIMIBE 10 MG PO TABS
10.0000 mg | ORAL_TABLET | Freq: Every day | ORAL | Status: DC
  Administered 2021-10-07: 10 mg via ORAL
  Filled 2021-10-06: qty 1

## 2021-10-06 MED ORDER — BUPIVACAINE LIPOSOME 1.3 % IJ SUSP: 20.0000 mL | Freq: Once | INTRAMUSCULAR | Status: DC

## 2021-10-06 MED ORDER — PAROXETINE HCL 10 MG PO TABS
20.0000 mg | ORAL_TABLET | Freq: Every day | ORAL | Status: DC
Start: 2021-10-07 — End: 2021-10-07
  Administered 2021-10-07: 20 mg via ORAL
  Filled 2021-10-06: qty 2

## 2021-10-06 MED ORDER — METHOCARBAMOL 500 MG IVPB - SIMPLE MED: 500.0000 mg | Freq: Four times a day (QID) | INTRAVENOUS | Status: DC | PRN

## 2021-10-06 MED ORDER — ASPIRIN 325 MG PO TBEC
325.0000 mg | DELAYED_RELEASE_TABLET | Freq: Two times a day (BID) | ORAL | Status: DC
  Administered 2021-10-07: 325 mg via ORAL
  Filled 2021-10-06: qty 1

## 2021-10-06 MED ORDER — DEXAMETHASONE SODIUM PHOSPHATE 10 MG/ML IJ SOLN
INTRAMUSCULAR | Status: AC
  Filled 2021-10-06: qty 1

## 2021-10-06 MED ORDER — DOCUSATE SODIUM 100 MG PO CAPS
100.0000 mg | ORAL_CAPSULE | Freq: Two times a day (BID) | ORAL | Status: DC
  Administered 2021-10-06 – 2021-10-07 (×2): 100 mg via ORAL
  Filled 2021-10-06 (×2): qty 1

## 2021-10-06 MED ORDER — PANTOPRAZOLE SODIUM 40 MG PO TBEC: 40.0000 mg | DELAYED_RELEASE_TABLET | Freq: Every day | ORAL | Status: DC | PRN

## 2021-10-06 MED ORDER — SODIUM CHLORIDE 0.9 % IR SOLN
Status: DC | PRN
  Administered 2021-10-06: 1000 mL

## 2021-10-06 MED ORDER — DIPHENHYDRAMINE HCL 12.5 MG/5ML PO ELIX: 12.5000 mg | ORAL_SOLUTION | ORAL | Status: DC | PRN

## 2021-10-06 MED ORDER — OXYCODONE HCL 5 MG PO TABS
5.0000 mg | ORAL_TABLET | ORAL | Status: DC | PRN
  Administered 2021-10-06 (×2): 5 mg via ORAL
  Filled 2021-10-06 (×2): qty 1

## 2021-10-06 MED ORDER — ACETAMINOPHEN 500 MG PO TABS
1000.0000 mg | ORAL_TABLET | Freq: Four times a day (QID) | ORAL | Status: AC
  Administered 2021-10-06 – 2021-10-07 (×4): 1000 mg via ORAL
  Filled 2021-10-06 (×4): qty 2

## 2021-10-06 MED ORDER — FENTANYL CITRATE (PF) 100 MCG/2ML IJ SOLN
INTRAMUSCULAR | Status: AC
  Filled 2021-10-06: qty 2

## 2021-10-06 MED ORDER — OMEPRAZOLE MAGNESIUM 20 MG PO TBEC: 20.0000 mg | DELAYED_RELEASE_TABLET | Freq: Every day | ORAL | Status: DC | PRN

## 2021-10-06 MED ORDER — PROPOFOL 500 MG/50ML IV EMUL
INTRAVENOUS | Status: AC
  Filled 2021-10-06: qty 50

## 2021-10-06 MED ORDER — OXYCODONE HCL 5 MG PO TABS
10.0000 mg | ORAL_TABLET | ORAL | Status: DC | PRN
  Administered 2021-10-06 – 2021-10-07 (×2): 10 mg via ORAL
  Filled 2021-10-06 (×2): qty 2

## 2021-10-06 MED ORDER — TRANEXAMIC ACID-NACL 1000-0.7 MG/100ML-% IV SOLN
1000.0000 mg | INTRAVENOUS | Status: AC
  Administered 2021-10-06: 1000 mg via INTRAVENOUS
  Filled 2021-10-06: qty 100

## 2021-10-06 MED ORDER — VANCOMYCIN HCL 1500 MG/300ML IV SOLN
1500.0000 mg | INTRAVENOUS | Status: AC
  Administered 2021-10-06: 1500 mg via INTRAVENOUS
  Filled 2021-10-06: qty 300

## 2021-10-06 MED ORDER — PHENOL 1.4 % MT LIQD: 1.0000 | OROMUCOSAL | Status: DC | PRN

## 2021-10-06 MED ORDER — METOCLOPRAMIDE HCL 5 MG PO TABS: 5.0000 mg | ORAL_TABLET | Freq: Three times a day (TID) | ORAL | Status: DC | PRN

## 2021-10-06 MED ORDER — CEFAZOLIN IN SODIUM CHLORIDE 3-0.9 GM/100ML-% IV SOLN
3.0000 g | INTRAVENOUS | Status: AC
  Administered 2021-10-06: 3 g via INTRAVENOUS
  Filled 2021-10-06: qty 100

## 2021-10-06 MED ORDER — FENTANYL CITRATE PF 50 MCG/ML IJ SOSY
50.0000 ug | PREFILLED_SYRINGE | Freq: Once | INTRAMUSCULAR | Status: AC
  Administered 2021-10-06: 50 ug via INTRAVENOUS
  Filled 2021-10-06: qty 2

## 2021-10-06 MED ORDER — FENTANYL CITRATE PF 50 MCG/ML IJ SOSY: 25.0000 ug | PREFILLED_SYRINGE | INTRAMUSCULAR | Status: DC | PRN

## 2021-10-06 MED ORDER — METOCLOPRAMIDE HCL 5 MG/ML IJ SOLN: 5.0000 mg | Freq: Three times a day (TID) | INTRAMUSCULAR | Status: DC | PRN

## 2021-10-06 MED ORDER — FLEET ENEMA 7-19 GM/118ML RE ENEM: 1.0000 | ENEMA | Freq: Once | RECTAL | Status: DC | PRN

## 2021-10-06 MED ORDER — VANCOMYCIN HCL IN DEXTROSE 1-5 GM/200ML-% IV SOLN: 1000.0000 mg | INTRAVENOUS | Status: DC

## 2021-10-06 SURGICAL SUPPLY — 58 items
ATTUNE MED DOME PAT 38 KNEE (Knees) ×1 IMPLANT
ATTUNE PS FEM LT SZ 8 CEM KNEE (Femur) ×1 IMPLANT
ATTUNE PSRP INSR SZ8 8 KNEE (Insert) ×1 IMPLANT
BAG COUNTER SPONGE SURGICOUNT (BAG) IMPLANT
BAG SPEC THK2 15X12 ZIP CLS (MISCELLANEOUS) ×1
BAG SPNG CNTER NS LX DISP (BAG)
BAG ZIPLOCK 12X15 (MISCELLANEOUS) ×2 IMPLANT
BASE TIBIAL ROT PLAT SZ 8 KNEE (Knees) IMPLANT
BLADE SAG 18X100X1.27 (BLADE) ×2 IMPLANT
BLADE SAW SGTL 11.0X1.19X90.0M (BLADE) ×2 IMPLANT
BNDG ELASTIC 6X5.8 VLCR STR LF (GAUZE/BANDAGES/DRESSINGS) ×2 IMPLANT
BOWL SMART MIX CTS (DISPOSABLE) ×2 IMPLANT
BSPLAT TIB 8 CMNT ROT PLAT STR (Knees) ×1 IMPLANT
CEMENT HV SMART SET (Cement) ×4 IMPLANT
COVER SURGICAL LIGHT HANDLE (MISCELLANEOUS) ×2 IMPLANT
CUFF TOURN SGL QUICK 34 (TOURNIQUET CUFF) ×2
CUFF TRNQT CYL 34X4.125X (TOURNIQUET CUFF) ×1 IMPLANT
DRAPE INCISE IOBAN 66X45 STRL (DRAPES) ×2 IMPLANT
DRAPE U-SHAPE 47X51 STRL (DRAPES) ×2 IMPLANT
DRSG AQUACEL AG ADV 3.5X10 (GAUZE/BANDAGES/DRESSINGS) ×2 IMPLANT
DURAPREP 26ML APPLICATOR (WOUND CARE) ×2 IMPLANT
ELECT REM PT RETURN 15FT ADLT (MISCELLANEOUS) ×2 IMPLANT
GLOVE BIO SURGEON STRL SZ 6.5 (GLOVE) IMPLANT
GLOVE BIO SURGEON STRL SZ7.5 (GLOVE) IMPLANT
GLOVE BIO SURGEON STRL SZ8 (GLOVE) ×2 IMPLANT
GLOVE BIOGEL PI IND STRL 6.5 (GLOVE) IMPLANT
GLOVE BIOGEL PI IND STRL 7.0 (GLOVE) IMPLANT
GLOVE BIOGEL PI IND STRL 8 (GLOVE) ×1 IMPLANT
GLOVE BIOGEL PI INDICATOR 6.5 (GLOVE)
GLOVE BIOGEL PI INDICATOR 7.0 (GLOVE)
GLOVE BIOGEL PI INDICATOR 8 (GLOVE) ×1
GOWN STRL REUS W/ TWL LRG LVL3 (GOWN DISPOSABLE) ×1 IMPLANT
GOWN STRL REUS W/ TWL XL LVL3 (GOWN DISPOSABLE) IMPLANT
GOWN STRL REUS W/TWL LRG LVL3 (GOWN DISPOSABLE) ×2
GOWN STRL REUS W/TWL XL LVL3 (GOWN DISPOSABLE)
HANDPIECE INTERPULSE COAX TIP (DISPOSABLE) ×2
HOLDER FOLEY CATH W/STRAP (MISCELLANEOUS) IMPLANT
IMMOBILIZER KNEE 20 (SOFTGOODS) ×2
IMMOBILIZER KNEE 20 THIGH 36 (SOFTGOODS) ×1 IMPLANT
KIT TURNOVER KIT A (KITS) IMPLANT
MANIFOLD NEPTUNE II (INSTRUMENTS) ×2 IMPLANT
NS IRRIG 1000ML POUR BTL (IV SOLUTION) ×2 IMPLANT
PACK TOTAL KNEE CUSTOM (KITS) ×2 IMPLANT
PADDING CAST COTTON 6X4 STRL (CAST SUPPLIES) ×3 IMPLANT
PROTECTOR NERVE ULNAR (MISCELLANEOUS) ×2 IMPLANT
SET HNDPC FAN SPRY TIP SCT (DISPOSABLE) ×1 IMPLANT
SPIKE FLUID TRANSFER (MISCELLANEOUS) ×2 IMPLANT
STRIP CLOSURE SKIN 1/2X4 (GAUZE/BANDAGES/DRESSINGS) ×4 IMPLANT
SUT MNCRL AB 4-0 PS2 18 (SUTURE) ×2 IMPLANT
SUT STRATAFIX 0 PDS 27 VIOLET (SUTURE) ×2
SUT VIC AB 2-0 CT1 27 (SUTURE) ×6
SUT VIC AB 2-0 CT1 TAPERPNT 27 (SUTURE) ×3 IMPLANT
SUTURE STRATFX 0 PDS 27 VIOLET (SUTURE) ×1 IMPLANT
TIBIAL BASE ROT PLAT SZ 8 KNEE (Knees) ×2 IMPLANT
TRAY FOLEY MTR SLVR 16FR STAT (SET/KITS/TRAYS/PACK) ×2 IMPLANT
TUBE SUCTION HIGH CAP CLEAR NV (SUCTIONS) ×2 IMPLANT
WATER STERILE IRR 1000ML POUR (IV SOLUTION) ×4 IMPLANT
WRAP KNEE MAXI GEL POST OP (GAUZE/BANDAGES/DRESSINGS) ×2 IMPLANT

## 2021-10-06 NOTE — Discharge Instructions (Signed)
 Frank Aluisio, MD Total Joint Specialist [facility] [place] [address] [place], [place] [address] [phone]  TOTAL KNEE REPLACEMENT POSTOPERATIVE DIRECTIONS    Knee Rehabilitation, Guidelines Following Surgery  Results after knee surgery are often greatly improved when you follow the exercise, range of motion and muscle strengthening exercises prescribed by your doctor. Safety measures are also important to protect the knee from further injury. If any of these exercises cause you to have increased pain or swelling in your knee joint, decrease the amount until you are comfortable again and slowly increase them. If you have problems or questions, call your caregiver or physical therapist for advice.   BLOOD CLOT PREVENTION Take a 325 mg Aspirin two times a day for three weeks following surgery. Then take an 81 mg Aspirin once a day for three weeks. Then discontinue Aspirin. You may resume your vitamins/supplements upon discharge from the hospital. Do not take any NSAIDs (Advil, Aleve, Ibuprofen, Meloxicam, etc.) until you have discontinued the 325 mg Aspirin.  HOME CARE INSTRUCTIONS  Remove items at home which could result in a fall. This includes throw rugs or furniture in walking pathways.  ICE to the affected knee as much as tolerated. Icing helps control swelling. If the swelling is well controlled you will be more comfortable and rehab easier. Continue to use ice on the knee for pain and swelling from surgery. You may notice swelling that will progress down to the foot and ankle. This is normal after surgery. Elevate the leg when you are not up walking on it.    Continue to use the breathing machine which will help keep your temperature down. It is common for your temperature to cycle up and down following surgery, especially at night when you are not up moving around and exerting yourself. The breathing machine keeps your lungs expanded and your temperature  down. Do not place pillow under the operative knee, focus on keeping the knee straight while resting  DIET You may resume your previous home diet once you are discharged from the hospital.  DRESSING / WOUND CARE / SHOWERING Keep your bulky bandage on for 2 days. On the third post-operative day you may remove the Ace bandage and gauze. There is a waterproof adhesive bandage on your skin which will stay in place until your first follow-up appointment. Once you remove this you will not need to place another bandage You may begin showering 3 days following surgery, but do not submerge the incision under water.  ACTIVITY For the first 5 days, the key is rest and control of pain and swelling Do your home exercises twice a day starting on post-operative day 3. On the days you go to physical therapy, just do the home exercises once that day. You should rest, ice and elevate the leg for 50 minutes out of every hour. Get up and walk/stretch for 10 minutes per hour. After 5 days you can increase your activity slowly as tolerated. Walk with your walker as instructed. Use the walker until you are comfortable transitioning to a cane. Walk with the cane in the opposite hand of the operative leg. You may discontinue the cane once you are comfortable and walking steadily. Avoid periods of inactivity such as sitting longer than an hour when not asleep. This helps prevent blood clots.  You may discontinue the knee immobilizer once you are able to perform a straight leg raise while lying down. You may resume a sexual relationship in one month   or when given the OK by your doctor.  You may return to work once you are cleared by your doctor.  Do not drive a car for 6 weeks or until released by your surgeon.  Do not drive while taking narcotics.  TED HOSE STOCKINGS Wear the elastic stockings on both legs for three weeks following surgery during the day. You may remove them at night for sleeping.  WEIGHT  BEARING Weight bearing as tolerated with assist device (walker, cane, etc) as directed, use it as long as suggested by your surgeon or therapist, typically at least 4-6 weeks.  POSTOPERATIVE CONSTIPATION PROTOCOL Constipation - defined medically as fewer than three stools per week and severe constipation as less than one stool per week.  One of the most common issues patients have following surgery is constipation.  Even if you have a regular bowel pattern at home, your normal regimen is likely to be disrupted due to multiple reasons following surgery.  Combination of anesthesia, postoperative narcotics, change in appetite and fluid intake all can affect your bowels.  In order to avoid complications following surgery, here are some recommendations in order to help you during your recovery period.  Colace (docusate) - Pick up an over-the-counter form of Colace or another stool softener and take twice a day as long as you are requiring postoperative pain medications.  Take with a full glass of water daily.  If you experience loose stools or diarrhea, hold the colace until you stool forms back up. If your symptoms do not get better within 1 week or if they get worse, check with your doctor. Dulcolax (bisacodyl) - Pick up over-the-counter and take as directed by the product packaging as needed to assist with the movement of your bowels.  Take with a full glass of water.  Use this product as needed if not relieved by Colace only.  MiraLax (polyethylene glycol) - Pick up over-the-counter to have on hand. MiraLax is a solution that will increase the amount of water in your bowels to assist with bowel movements.  Take as directed and can mix with a glass of water, juice, soda, coffee, or tea. Take if you go more than two days without a movement. Do not use MiraLax more than once per day. Call your doctor if you are still constipated or irregular after using this medication for 7 days in a row.  If you continue  to have problems with postoperative constipation, please contact the office for further assistance and recommendations.  If you experience "the worst abdominal pain ever" or develop nausea or vomiting, please contact the office immediatly for further recommendations for treatment.  ITCHING If you experience itching with your medications, try taking only a single pain pill, or even half a pain pill at a time.  You can also use Benadryl over the counter for itching or also to help with sleep.   MEDICATIONS See your medication summary on the "After Visit Summary" that the nursing staff will review with you prior to discharge.  You may have some home medications which will be placed on hold until you complete the course of blood thinner medication.  It is important for you to complete the blood thinner medication as prescribed by your surgeon.  Continue your approved medications as instructed at time of discharge.  PRECAUTIONS If you experience chest pain or shortness of breath - call 911 immediately for transfer to the hospital emergency department.  If you develop a fever greater that 101 F,   purulent drainage from wound, increased redness or drainage from wound, foul odor from the wound/dressing, or calf pain - CONTACT YOUR SURGEON.                                                   FOLLOW-UP APPOINTMENTS Make sure you keep all of your appointments after your operation with your surgeon and caregivers. You should call the office at the above phone number and make an appointment for approximately two weeks after the date of your surgery or on the date instructed by your surgeon outlined in the "After Visit Summary".  RANGE OF MOTION AND STRENGTHENING EXERCISES  Rehabilitation of the knee is important following a knee injury or an operation. After just a few days of immobilization, the muscles of the thigh which control the knee become weakened and shrink (atrophy). Knee exercises are designed to build up  the tone and strength of the thigh muscles and to improve knee motion. Often times heat used for twenty to thirty minutes before working out will loosen up your tissues and help with improving the range of motion but do not use heat for the first two weeks following surgery. These exercises can be done on a training (exercise) mat, on the floor, on a table or on a bed. Use what ever works the best and is most comfortable for you Knee exercises include:  Leg Lifts - While your knee is still immobilized in a splint or cast, you can do straight leg raises. Lift the leg to 60 degrees, hold for 3 sec, and slowly lower the leg. Repeat 10-20 times 2-3 times daily. Perform this exercise against resistance later as your knee gets better.  Quad and Hamstring Sets - Tighten up the muscle on the front of the thigh (Quad) and hold for 5-10 sec. Repeat this 10-20 times hourly. Hamstring sets are done by pushing the foot backward against an object and holding for 5-10 sec. Repeat as with quad sets.  Leg Slides: Lying on your back, slowly slide your foot toward your buttocks, bending your knee up off the floor (only go as far as is comfortable). Then slowly slide your foot back down until your leg is flat on the floor again. Angel Wings: Lying on your back spread your legs to the side as far apart as you can without causing discomfort.  A rehabilitation program following serious knee injuries can speed recovery and prevent re-injury in the future due to weakened muscles. Contact your doctor or a physical therapist for more information on knee rehabilitation.   POST-OPERATIVE OPIOID TAPER INSTRUCTIONS: It is important to wean off of your opioid medication as soon as possible. If you do not need pain medication after your surgery it is ok to stop day one. Opioids include: Codeine, Hydrocodone(Norco, Vicodin), Oxycodone(Percocet, oxycontin) and hydromorphone amongst others.  Long term and even short term use of opiods can  cause: Increased pain response Dependence Constipation Depression Respiratory depression And more.  Withdrawal symptoms can include Flu like symptoms Nausea, vomiting And more Techniques to manage these symptoms Hydrate well Eat regular healthy meals Stay active Use relaxation techniques(deep breathing, meditating, yoga) Do Not substitute Alcohol to help with tapering If you have been on opioids for less than two weeks and do not have pain than it is ok to stop all together.  Plan   to wean off of opioids This plan should start within one week post op of your joint replacement. Maintain the same interval or time between taking each dose and first decrease the dose.  Cut the total daily intake of opioids by one tablet each day Next start to increase the time between doses. The last dose that should be eliminated is the evening dose.   IF YOU ARE TRANSFERRED TO A SKILLED REHAB FACILITY If the patient is transferred to a skilled rehab facility following release from the hospital, a list of the current medications will be sent to the facility for the patient to continue.  When discharged from the skilled rehab facility, please have the facility set up the patient's Home Health Physical Therapy prior to being released. Also, the skilled facility will be responsible for providing the patient with their medications at time of release from the facility to include their pain medication, the muscle relaxants, and their blood thinner medication. If the patient is still at the rehab facility at time of the two week follow up appointment, the skilled rehab facility will also need to assist the patient in arranging follow up appointment in our office and any transportation needs.  MAKE SURE YOU:  Understand these instructions.  Get help right away if you are not doing well or get worse.   DENTAL ANTIBIOTICS:  In most cases prophylactic antibiotics for Dental procdeures after total joint surgery are  not necessary.  Exceptions are as follows:  1. History of prior total joint infection  2. Severely immunocompromised (Organ Transplant, cancer chemotherapy, Rheumatoid biologic medications such as Humera)  3. Poorly controlled diabetes (A1C &gt; 8.0, blood glucose over 200)  If you have one of these conditions, contact your surgeon for an antibiotic prescription, prior to your dental procedure.    Pick up stool softner and laxative for home use following surgery while on pain medications. Do not submerge incision under water. Please use good hand washing techniques while changing dressing each day. May shower starting three days after surgery. Please use a clean towel to pat the incision dry following showers. Continue to use ice for pain and swelling after surgery. Do not use any lotions or creams on the incision until instructed by your surgeon.  

## 2021-10-06 NOTE — Anesthesia Postprocedure Evaluation (Signed)
Anesthesia Post Note  Patient: Craig Rice  Procedure(s) Performed: TOTAL KNEE ARTHROPLASTY (Left: Knee)     Patient location during evaluation: PACU Anesthesia Type: Spinal Level of consciousness: oriented and awake and alert Pain management: pain level controlled Vital Signs Assessment: post-procedure vital signs reviewed and stable Respiratory status: spontaneous breathing, respiratory function stable and nonlabored ventilation Cardiovascular status: blood pressure returned to baseline and stable Postop Assessment: no headache, no backache, no apparent nausea or vomiting and spinal receding Anesthetic complications: no   No notable events documented.  Last Vitals:  Vitals:   10/06/21 1322 10/06/21 1507  BP: 123/83 131/79  Pulse: (!) 52 63  Resp: 20 18  Temp: (!) 36.4 C 36.4 C  SpO2: 97% 100%    Last Pain:  Vitals:   10/06/21 1610  TempSrc:   PainSc: 5                  Lidia Collum

## 2021-10-06 NOTE — Anesthesia Procedure Notes (Signed)
Spinal  Patient location during procedure: OR Reason for block: surgical anesthesia Staffing Performed: anesthesiologist  Anesthesiologist: Marycatherine Maniscalco E, MD Performed by: Myria Steenbergen E, MD Authorized by: Lucyann Romano E, MD   Preanesthetic Checklist Completed: patient identified, IV checked, risks and benefits discussed, surgical consent, monitors and equipment checked, pre-op evaluation and timeout performed Spinal Block Patient position: sitting Prep: DuraPrep and site prepped and draped Patient monitoring: continuous pulse ox, blood pressure and heart rate Approach: midline Location: L3-4 Injection technique: single-shot Needle Needle type: Pencan  Needle gauge: 24 G Needle length: 10 cm Assessment Events: CSF return Additional Notes Functioning IV was confirmed and monitors were applied. Sterile prep and drape, including hand hygiene and sterile gloves were used. The patient was positioned and the spine was prepped. The skin was anesthetized with lidocaine.  Free flow of clear CSF was obtained prior to injecting local anesthetic into the CSF. The needle was carefully withdrawn. The patient tolerated the procedure well.      

## 2021-10-06 NOTE — Care Plan (Signed)
Ortho Bundle Case Management Note  Patient Details  Name: Craig Rice MRN: [id number] Date of Birth: [birth year]  L TKA on 10-06-21 DCP:  Home with wife DME:  No needs, has a RW PT:  [facility] on 10-09-21                   DME Arranged:    DME Agency:     HH Arranged:    [place] Agency:     Additional Comments: Please contact me with any questions of if this plan should need to change.    10/06/2021, 3:47 PM

## 2021-10-06 NOTE — Anesthesia Procedure Notes (Signed)
Anesthesia Regional Block: Adductor canal block   Pre-Anesthetic Checklist: , timeout performed,  Correct Patient, Correct Site, Correct Laterality,  Correct Procedure, Correct Position, site marked,  Risks and benefits discussed,  Surgical consent,  Pre-op evaluation,  At surgeon's request and post-op pain management  Laterality: Left  Prep: chloraprep       Needles:  Injection technique: Single-shot  Needle Type: Echogenic Stimulator Needle     Needle Length: 10cm  Needle Gauge: 20     Additional Needles:   Procedures:,,,, ultrasound used (permanent image in chart),,    Narrative:  Start time: 10/06/2021 9:15 AM End time: 10/06/2021 9:19 AM Injection made incrementally with aspirations every 5 mL.  Performed by: Personally  Anesthesiologist: Lidia Collum, MD  Additional Notes: Standard monitors applied. Skin prepped. Good needle visualization with ultrasound. Injection made in 5cc increments with no resistance to injection. Patient tolerated the procedure well.

## 2021-10-06 NOTE — Anesthesia Preprocedure Evaluation (Signed)
Anesthesia Evaluation  Patient identified by MRN, date of birth, ID band Patient awake    Reviewed: Allergy & Precautions, NPO status , Patient's Chart, lab work & pertinent test results  History of Anesthesia Complications Negative for: history of anesthetic complications  Airway Mallampati: III  TM Distance: >3 FB Neck ROM: Full    Dental  (+) Teeth Intact   Pulmonary sleep apnea and Continuous Positive Airway Pressure Ventilation , former smoker,    Pulmonary exam normal        Cardiovascular negative cardio ROS Normal cardiovascular exam     Neuro/Psych  Headaches, Depression    GI/Hepatic Neg liver ROS, hiatal hernia, GERD  ,  Endo/Other  negative endocrine ROS  Renal/GU negative Renal ROS  negative genitourinary   Musculoskeletal  (+) Arthritis , Osteoarthritis,    Abdominal   Peds  Hematology negative hematology ROS (+)   Anesthesia Other Findings   Reproductive/Obstetrics                            Anesthesia Physical Anesthesia Plan  ASA: 2  Anesthesia Plan: Spinal   Post-op Pain Management: Regional block*, Tylenol PO (pre-op)* and Toradol IV (intra-op)*   Induction:   PONV Risk Score and Plan: 1 and Propofol infusion, Treatment may vary due to age or medical condition, Ondansetron and TIVA  Airway Management Planned: Nasal Cannula and Simple Face Mask  Additional Equipment: None  Intra-op Plan:   Post-operative Plan:   Informed Consent: I have reviewed the patients History and Physical, chart, labs and discussed the procedure including the risks, benefits and alternatives for the proposed anesthesia with the patient or authorized representative who has indicated his/her understanding and acceptance.       Plan Discussed with:   Anesthesia Plan Comments:         Anesthesia Quick Evaluation

## 2021-10-06 NOTE — Op Note (Signed)
OPERATIVE REPORT-TOTAL KNEE ARTHROPLASTY   Pre-operative diagnosis- Osteoarthritis  Left knee(s)  Post-operative diagnosis- Osteoarthritis Left knee(s)  Procedure-  Left  Total Knee Arthroplasty  Surgeon- Dione Plover. Jameson Tormey, MD  Assistant- Jaynie Bream, PA-C   Anesthesia-   Adductor canal block and spinal  EBL-50 mL   Drains None  Tourniquet time-  Total Tourniquet Time Documented: Thigh (Left) - 45 minutes Total: Thigh (Left) - 45 minutes     Complications- None  Condition-PACU - hemodynamically stable.   Brief Clinical Note   TECUMSEH YEAGLEY is a 76 y.o. year old male with end stage OA of his left knee with progressively worsening pain and dysfunction. He has constant pain, with activity and at rest and significant functional deficits with difficulties even with ADLs. He has had extensive non-op management including analgesics, injections of cortisone and viscosupplements, and home exercise program, but remains in significant pain with significant dysfunction. Radiographs show bone on bone arthritis medial and patellofemoral. He presents now for left Total Knee Arthroplasty.     Procedure in detail---   The patient is brought into the operating room and positioned supine on the operating table. After successful administration of  Adductor canal block and spinal,   a tourniquet is placed high on the  Left thigh(s) and the lower extremity is prepped and draped in the usual sterile fashion. Time out is performed by the operating team and then the  Left lower extremity is wrapped in Esmarch, knee flexed and the tourniquet inflated to 300 mmHg.       A midline incision is made with a ten blade through the subcutaneous tissue to the level of the extensor mechanism. A fresh blade is used to make a medial parapatellar arthrotomy. Soft tissue over the proximal medial tibia is subperiosteally elevated to the joint line with a knife and into the semimembranosus bursa with a Cobb elevator.  Soft tissue over the proximal lateral tibia is elevated with attention being paid to avoiding the patellar tendon on the tibial tubercle. The patella is everted, knee flexed 90 degrees and the ACL and PCL are removed. Findings are bone on bone medial and patellofemoral with large global osteophytes.        The drill is used to create a starting hole in the distal femur and the canal is thoroughly irrigated with sterile saline to remove the fatty contents. The 5 degree Left  valgus alignment guide is placed into the femoral canal and the distal femoral cutting block is pinned to remove 9 mm off the distal femur. Resection is made with an oscillating saw.      The tibia is subluxed forward and the menisci are removed. The extramedullary alignment guide is placed referencing proximally at the medial aspect of the tibial tubercle and distally along the second metatarsal axis and tibial crest. The block is pinned to remove 19m off the more deficient medial  side. Resection is made with an oscillating saw. Size 8is the most appropriate size for the tibia and the proximal tibia is prepared with the modular drill and keel punch for that size.      The femoral sizing guide is placed and size 8 is most appropriate. Rotation is marked off the epicondylar axis and confirmed by creating a rectangular flexion gap at 90 degrees. The size 8 cutting block is pinned in this rotation and the anterior, posterior and chamfer cuts are made with the oscillating saw. The intercondylar block is then placed and that cut  is made.      Trial size 8 tibial component, trial size 8 posterior stabilized femur and a 8  mm posterior stabilized rotating platform insert trial is placed. Full extension is achieved with excellent varus/valgus and anterior/posterior balance throughout full range of motion. The patella is everted and thickness measured to be 26  mm. Free hand resection is taken to 15 mm, a 38 template is placed, lug holes are drilled,  trial patella is placed, and it tracks normally. Osteophytes are removed off the posterior femur with the trial in place. All trials are removed and the cut bone surfaces prepared with pulsatile lavage. Cement is mixed and once ready for implantation, the size 8 tibial implant, size  8 posterior stabilized femoral component, and the size 38 patella are cemented in place and the patella is held with the clamp. The trial insert is placed and the knee held in full extension. The Exparel (20 ml mixed with 60 ml saline) is injected into the extensor mechanism, posterior capsule, medial and lateral gutters and subcutaneous tissues.  All extruded cement is removed and once the cement is hard the permanent 8 mm posterior stabilized rotating platform insert is placed into the tibial tray.      The wound is copiously irrigated with saline solution and the extensor mechanism closed with # 0 Stratofix suture. The tourniquet is released for a total tourniquet time of 45  minutes. Flexion against gravity is 140 degrees and the patella tracks normally. Subcutaneous tissue is closed with 2.0 vicryl and subcuticular with running 4.0 Monocryl. The incision is cleaned and dried and steri-strips and a bulky sterile dressing are applied. The limb is placed into a knee immobilizer and the patient is awakened and transported to recovery in stable condition.      Please note that a surgical assistant was a medical necessity for this procedure in order to perform it in a safe and expeditious manner. Surgical assistant was necessary to retract the ligaments and vital neurovascular structures to prevent injury to them and also necessary for proper positioning of the limb to allow for anatomic placement of the prosthesis.   Dione Plover Markea Ruzich, MD    10/06/2021, 10:48 AM

## 2021-10-06 NOTE — Transfer of Care (Signed)
Immediate Anesthesia Transfer of Care Note  Patient: Craig Rice  Procedure(s) Performed: TOTAL KNEE ARTHROPLASTY (Left: Knee)  Patient Location: PACU  Anesthesia Type:MAC, Regional and Spinal  Level of Consciousness: drowsy and patient cooperative  Airway & Oxygen Therapy: Patient Spontanous Breathing  Post-op Assessment: Report given to RN and Post -op Vital signs reviewed and stable  Post vital signs: Reviewed and stable  Last Vitals:  Vitals Value Taken Time  BP    Temp    Pulse    Resp    SpO2      Last Pain:  Vitals:   10/06/21 0914  TempSrc:   PainSc: 0-No pain         Complications: No notable events documented.

## 2021-10-06 NOTE — Plan of Care (Signed)
Plan of care reviewed and discussed with the patient. 

## 2021-10-06 NOTE — Plan of Care (Signed)
  Problem: Education: Goal: Knowledge of the prescribed therapeutic regimen will improve Outcome: Progressing   Problem: Activity: Goal: Ability to avoid complications of mobility impairment will improve Outcome: Progressing   Problem: Pain Management: Goal: Pain level will decrease with appropriate interventions Outcome: Progressing   

## 2021-10-06 NOTE — Evaluation (Signed)
Physical Therapy Evaluation Patient Details Name: Craig Rice MRN: [id number] DOB: May 19, 1945 Today's Date: 10/06/2021  History of Present Illness  76 yo s//  LTKA on 09/2421. PMH: R THA and RTKA,  back surgery, SA, depression.  Clinical Impression  The patient is eager to ambulate, reports  pain is minimal. Patient  with slight anesthesia effects but able to safely  Ambulate x 110'.  Patient should progress toDc home tomorrow. Pt admitted with above diagnosis.  Pt currently with functional limitations due to the deficits listed below (see PT Problem List). Pt will benefit from skilled PT to increase their independence and safety with mobility to allow discharge to the venue listed below.         Recommendations for follow up therapy are one component of a multi-disciplinary discharge planning process, led by the attending physician.  Recommendations may be updated based on patient status, additional functional criteria and insurance authorization.  Follow Up Recommendations Follow physician's recommendations for discharge plan and follow up therapies      Assistance Recommended at Discharge Set up Supervision/Assistance  Patient can return home with the following  A little help with walking and/or transfers;Assistance with cooking/housework;Help with stairs or ramp for entrance;A little help with bathing/dressing/bathroom    Equipment Recommendations None recommended by PT  Recommendations for Other Services       Functional Status Assessment Patient has had a recent decline in their functional status and demonstrates the ability to make significant improvements in function in a reasonable and predictable amount of time.     Precautions / Restrictions Precautions Precautions: Fall;Knee Required Braces or Orthoses: Knee Immobilizer - Right;Knee Immobilizer - Left Knee Immobilizer - Right: Discontinue once straight leg raise with < 10 degree lag      Mobility  Bed  Mobility Overal bed mobility: Needs Assistance Bed Mobility: Supine to Sit     Supine to sit: Supervision, HOB elevated          Transfers Overall transfer level: Needs assistance Equipment used: Rolling walker (2 wheels) Transfers: Sit to/from Stand Sit to Stand: Min assist           General transfer comment: noted LLE slightly buckling, cues for hand and  LLE position    Ambulation/Gait Ambulation/Gait assistance: Min assist Gait Distance (Feet): 110 Feet Assistive device: Rolling walker (2 wheels) Gait Pattern/deviations: Step-to pattern, Step-through pattern Gait velocity: decr     General Gait Details: close guarding, slight buckling L knee  Stairs            Wheelchair Mobility    Modified Rankin (Stroke Patients Only)       Balance Overall balance assessment: Mild deficits observed, not formally tested                                           Pertinent Vitals/Pain Pain Assessment Pain Assessment: No/denies pain    Home Living Family/patient expects to be discharged to:: Private residence Living Arrangements: Spouse/significant other Available Help at Discharge: Family;Available 24 hours/day Type of Home: House Home Access: Stairs to enter Entrance Stairs-Rails: Right Entrance Stairs-Number of Steps: 3   Home Layout: Able to live on main level with bedroom/bathroom Home Equipment: Rolling Walker (2 wheels);BSC/3in1      Prior Function Prior Level of Function : Independent/Modified Independent  Hand Dominance   Dominant Hand: Right    Extremity/Trunk Assessment   Upper Extremity Assessment Upper Extremity Assessment: Overall WFL for tasks assessed    Lower Extremity Assessment Lower Extremity Assessment: LLE deficits/detail LLE Deficits / Details: able to  L SLR, Knee flexion to ` 70, mild decreased swing control    Cervical / Trunk Assessment Cervical / Trunk Assessment: Normal   Communication   Communication: No difficulties  Cognition Arousal/Alertness: Awake/alert Behavior During Therapy: WFL for tasks assessed/performed Overall Cognitive Status: Within Functional Limits for tasks assessed                                          General Comments General comments (skin integrity, edema, etc.): needs Rw for support    Exercises Total Joint Exercises Ankle Circles/Pumps: AROM, Both, 10 reps Quad Sets: AROM, Both, 10 reps   Assessment/Plan    PT Assessment Patient needs continued PT services  PT Problem List Decreased strength;Decreased mobility;Decreased range of motion;Decreased knowledge of precautions;Decreased activity tolerance;Decreased balance;Impaired sensation       PT Treatment Interventions DME instruction;Therapeutic activities;Gait training;Patient/family education;Therapeutic exercise;Stair training;Functional mobility training    PT Goals (Current goals can be found in the Care Plan section)  Acute Rehab PT Goals Patient Stated Goal: go home PT Goal Formulation: With patient/family Time For Goal Achievement: 10/20/21 Potential to Achieve Goals: Good    Frequency 7X/week     Co-evaluation               AM-PAC PT "6 Clicks" Mobility  Outcome Measure Help needed turning from your back to your side while in a flat bed without using bedrails?: None Help needed moving from lying on your back to sitting on the side of a flat bed without using bedrails?: None Help needed moving to and from a bed to a chair (including a wheelchair)?: A Little Help needed standing up from a chair using your arms (e.g., wheelchair or bedside chair)?: A Little Help needed to walk in hospital room?: A Little Help needed climbing 3-5 steps with a railing? : A Lot 6 Click Score: 19    End of Session Equipment Utilized During Treatment: Gait belt;Left knee immobilizer Activity Tolerance: Patient tolerated treatment well Patient left:  in chair;with call bell/phone within reach;with chair alarm set Nurse Communication: Mobility status PT Visit Diagnosis: Unsteadiness on feet (R26.81)    Time: 1937-9024 PT Time Calculation (min) (ACUTE ONLY): 31 min   Charges:   PT Evaluation $PT Eval Low Complexity: 1 Low PT Treatments $Gait Training: 8-22 mins        [place] Office [phone] Weekend pager-[phone]   Claretha Cooper 10/06/2021, 4:32 PM

## 2021-10-06 NOTE — Interval H&P Note (Signed)
History and Physical Interval Note:  10/06/2021 7:15 AM  Craig Rice  has presented today for surgery, with the diagnosis of left knee osteoarthritis.  The various methods of treatment have been discussed with the patient and family. After consideration of risks, benefits and other options for treatment, the patient has consented to  Procedure(s): TOTAL KNEE ARTHROPLASTY (Left) as a surgical intervention.  The patient's history has been reviewed, patient examined, no change in status, stable for surgery.  I have reviewed the patient's chart and labs.  Questions were answered to the patient's satisfaction.     Pilar Plate Craig Rice

## 2021-10-06 NOTE — Progress Notes (Signed)
Pt. Set up on CPAP. Pt resting comfortable.

## 2021-10-07 DIAGNOSIS — M25562 Pain in left knee: Secondary | ICD-10-CM | POA: Diagnosis not present

## 2021-10-07 DIAGNOSIS — Z96641 Presence of right artificial hip joint: Secondary | ICD-10-CM | POA: Diagnosis not present

## 2021-10-07 DIAGNOSIS — Z79899 Other long term (current) drug therapy: Secondary | ICD-10-CM | POA: Diagnosis not present

## 2021-10-07 DIAGNOSIS — Z96651 Presence of right artificial knee joint: Secondary | ICD-10-CM | POA: Diagnosis not present

## 2021-10-07 DIAGNOSIS — Z87891 Personal history of nicotine dependence: Secondary | ICD-10-CM | POA: Diagnosis not present

## 2021-10-07 DIAGNOSIS — M1712 Unilateral primary osteoarthritis, left knee: Secondary | ICD-10-CM | POA: Diagnosis not present

## 2021-10-07 LAB — BASIC METABOLIC PANEL
Anion gap: 7 (ref 5–15)
BUN: 21 mg/dL (ref 8–23)
CO2: 24 mmol/L (ref 22–32)
Calcium: 9 mg/dL (ref 8.9–10.3)
Chloride: 108 mmol/L (ref 98–111)
Creatinine, Ser: 1.02 mg/dL (ref 0.61–1.24)
GFR, Estimated: >60 mL/min (ref >60)
Glucose, Bld: 137 mg/dL — ABNORMAL HIGH (ref 70–99)
Potassium: 5 mmol/L (ref 3.5–5.1)
Sodium: 139 mmol/L (ref 135–145)

## 2021-10-07 LAB — CBC
HCT: 34.6 % — ABNORMAL LOW (ref 39.0–52.0)
Hemoglobin: 11.6 g/dL — ABNORMAL LOW (ref 13.0–17.0)
MCH: 30.9 pg (ref 26.0–34.0)
MCHC: 33.5 g/dL (ref 30.0–36.0)
MCV: 92 fL (ref 80.0–100.0)
Platelets: 151 10*3/uL (ref 150–400)
RBC: 3.76 MIL/uL — ABNORMAL LOW (ref 4.22–5.81)
RDW: 12.2 % (ref 11.5–15.5)
WBC: 13 10*3/uL — ABNORMAL HIGH (ref 4.0–10.5)
nRBC: 0 % (ref 0.0–0.2)

## 2021-10-07 MED ORDER — METHOCARBAMOL 500 MG PO TABS: 500.0000 mg | ORAL_TABLET | Freq: Four times a day (QID) | ORAL | 0 refills | Status: AC | PRN

## 2021-10-07 MED ORDER — OXYCODONE HCL 5 MG PO TABS
5.0000 mg | ORAL_TABLET | Freq: Four times a day (QID) | ORAL | 0 refills | Status: AC | PRN
Start: 2021-10-07 — End: ?

## 2021-10-07 MED ORDER — ASPIRIN 325 MG PO TBEC: 325.0000 mg | DELAYED_RELEASE_TABLET | Freq: Two times a day (BID) | ORAL | 0 refills | Status: AC

## 2021-10-07 NOTE — TOC Transition Note (Signed)
Transition of Care [facility]) - CM/SW Discharge Note  Patient Details  Name: Craig Rice MRN: [id number] Date of Birth: 1946/01/01  Transition of Care [facility]) CM/SW Contact:  Sherie Don, LCSW Phone Number: 10/07/2021, 9:28 AM  Clinical Narrative: Patient is expected to discharge home after working with PT. CSW met with patient to confirm discharge plan. Patient will go home with OPPT at [facility]. Patient reported he has a standard walker with no wheels at home, which he has used for previous orthopedic surgeries without any issues. TOC signing off.  Final next level of care: OP Rehab Barriers to Discharge: No Barriers Identified  Patient Goals and CMS Choice Patient states their goals for this hospitalization and ongoing recovery are:: Discharge home with OPPT at [facility] Choice offered to / list presented to : NA  Discharge Plan and Services         DME Arranged: N/A DME Agency: NA  Readmission Risk Interventions     No data to display

## 2021-10-07 NOTE — Progress Notes (Signed)
   Subjective: 1 Day Post-Op Procedure(s) (LRB): TOTAL KNEE ARTHROPLASTY (Left) Patient reports pain as mild.   Patient seen in rounds by Dr. Wynelle Link. Patient is well, and has had no acute complaints or problems No issues overnight. Denies chest pain, SOB, or calf pain. Foley catheter to be removed this AM.  We will continue therapy today, ambulated 110' yesterday.   Objective: Vital signs in last 24 hours: Temp:  [97.5 F (36.4 C)-98.1 F (36.7 C)] 97.8 F (36.6 C) (07/25 0552) Pulse Rate:  [48-64] 60 (07/25 0552) Resp:  [12-20] 17 (07/25 0552) BP: (102-149)/(64-96) 141/64 (07/25 0552) SpO2:  [94 %-100 %] 95 % (07/25 0552) Weight:  [124.3 kg] 124.3 kg (07/24 1322)  Intake/Output from previous day:  Intake/Output Summary (Last 24 hours) at 10/07/2021 0820 Last data filed at 10/07/2021 0600 Gross per 24 hour  Intake 3001.48 ml  Output 3525 ml  Net -523.52 ml     Intake/Output this shift: No intake/output data recorded.  Labs: Recent Labs    10/07/21 0329  HGB 11.6*   Recent Labs    10/07/21 0329  WBC 13.0*  RBC 3.76*  HCT 34.6*  PLT 151   Recent Labs    10/07/21 0329  NA 139  K 5.0  CL 108  CO2 24  BUN 21  CREATININE 1.02  GLUCOSE 137*  CALCIUM 9.0   No results for input(s): "LABPT", "INR" in the last 72 hours.  Exam: General - Patient is Alert and Oriented Extremity - Neurologically intact Neurovascular intact Sensation intact distally Dorsiflexion/Plantar flexion intact Dressing - dressing C/D/I Motor Function - intact, moving foot and toes well on exam.   Past Medical History:  Diagnosis Date   Arthritis    osteoarthritis -knee   Depression    GERD (gastroesophageal reflux disease)    H/O hiatal hernia    Headache(784.0)    tx. migraines-"Metoprolol"   Pre-diabetes    manage with diet.   Sleep apnea    C-PAP    Assessment/Plan: 1 Day Post-Op Procedure(s) (LRB): TOTAL KNEE ARTHROPLASTY (Left) Principal Problem:   Osteoarthritis  of left knee  Estimated body mass index is 38.22 kg/m as calculated from the following:   Height as of this encounter: '5\' 11"'$  (1.803 m).   Weight as of this encounter: 124.3 kg. Advance diet Up with therapy D/C IV fluids   Patient's anticipated LOS is less than 2 midnights, meeting these requirements: - Lives within 1 hour of care - Has a competent adult at home to recover with post-op recover - NO history of  - Chronic pain requiring opioids  - Diabetes  - Coronary Artery Disease  - Heart failure  - Heart attack  - Stroke  - DVT/VTE  - Cardiac arrhythmia  - Respiratory Failure/COPD  - Renal failure  - Anemia  - Advanced Liver disease  DVT Prophylaxis - Aspirin Weight bearing as tolerated. Continue therapy.  Plan is to go Home after hospital stay. Plan for discharge later today if progresses with therapy and meeting goals. Scheduled for OPPT at [facility]. Follow-up in the office in 2 weeks.  The PDMP database was reviewed today prior to any opioid medications being prescribed to this patient.  Theresa Duty, PA-C Orthopedic Surgery [phone] 10/07/2021, 8:20 AM

## 2021-10-07 NOTE — Plan of Care (Signed)
  Problem: Education: Goal: Knowledge of General Education information will improve Description: Including pain rating scale, medication(s)/side effects and non-pharmacologic comfort measures Outcome: Adequate for Discharge   Problem: Health Behavior/Discharge Planning: Goal: Ability to manage health-related needs will improve Outcome: Adequate for Discharge   Problem: Clinical Measurements: Goal: Ability to maintain clinical measurements within normal limits will improve Outcome: Adequate for Discharge Goal: Will remain free from infection Outcome: Adequate for Discharge Goal: Diagnostic test results will improve Outcome: Adequate for Discharge Goal: Respiratory complications will improve Outcome: Adequate for Discharge Goal: Cardiovascular complication will be avoided Outcome: Adequate for Discharge   Problem: Activity: Goal: Risk for activity intolerance will decrease Outcome: Adequate for Discharge   Problem: Coping: Goal: Level of anxiety will decrease Outcome: Adequate for Discharge   Problem: Elimination: Goal: Will not experience complications related to bowel motility Outcome: Adequate for Discharge Goal: Will not experience complications related to urinary retention Outcome: Adequate for Discharge   Problem: Pain Managment: Goal: General experience of comfort will improve Outcome: Adequate for Discharge   Problem: Safety: Goal: Ability to remain free from injury will improve Outcome: Adequate for Discharge   Problem: Skin Integrity: Goal: Risk for impaired skin integrity will decrease Outcome: Adequate for Discharge   Problem: Education: Goal: Knowledge of the prescribed therapeutic regimen will improve Outcome: Adequate for Discharge   Problem: Activity: Goal: Ability to avoid complications of mobility impairment will improve Outcome: Adequate for Discharge Goal: Range of joint motion will improve Outcome: Adequate for Discharge   Problem: Clinical  Measurements: Goal: Postoperative complications will be avoided or minimized Outcome: Adequate for Discharge   Problem: Pain Management: Goal: Pain level will decrease with appropriate interventions Outcome: Adequate for Discharge   Problem: Acute Rehab PT Goals(only PT should resolve) Goal: Pt Will Go Supine/Side To Sit Outcome: Adequate for Discharge Goal: Pt Will Go Sit To Supine/Side Outcome: Adequate for Discharge Goal: Patient Will Transfer Sit To/From Stand Outcome: Adequate for Discharge Goal: Pt Will Ambulate Outcome: Adequate for Discharge Goal: Pt Will Go Up/Down Stairs Outcome: Adequate for Discharge Goal: Pt/caregiver will Perform Home Exercise Program Outcome: Adequate for Discharge

## 2021-10-07 NOTE — Progress Notes (Signed)
Physical Therapy Treatment Patient Details Name: Craig Rice MRN: [id number] DOB: 09-May-1945 Today's Date: 10/07/2021   History of Present Illness 76 yo s//  LTKA on 09/2421. PMH: R THA and RTKA,  back surgery, SA, depression.    PT Comments    POD # 1 am session Assisted OOB to amb in hallway went well.  General bed mobility comments: demonstarted and instructed on use of belt to assist LE.  General transfer comment: 25% VC's on proper hand placement and safety with turns. General Gait Details: 25% VC's on proper walker to self distance and safety with turns.  Then returned to room to perform some TE's following HEP handout.  Instructed on proper tech, freq as well as use of ICE.   Pt will need another PT session to complete HEP and practice stairs.     Recommendations for follow up therapy are one component of a multi-disciplinary discharge planning process, led by the attending physician.  Recommendations may be updated based on patient status, additional functional criteria and insurance authorization.  Follow Up Recommendations  Follow physician's recommendations for discharge plan and follow up therapies     Assistance Recommended at Discharge Set up Supervision/Assistance  Patient can return home with the following A little help with walking and/or transfers;Assistance with cooking/housework;Help with stairs or ramp for entrance;A little help with bathing/dressing/bathroom   Equipment Recommendations  None recommended by PT    Recommendations for Other Services       Precautions / Restrictions Precautions Precautions: Fall;Knee Precaution Comments: instructed no pillow under knee Restrictions Weight Bearing Restrictions: No Other Position/Activity Restrictions: WBAT     Mobility  Bed Mobility Overal bed mobility: Needs Assistance Bed Mobility: Supine to Sit     Supine to sit: Supervision, HOB elevated     General bed mobility comments: demonstarted and  instructed on use of belt to assist LE    Transfers Overall transfer level: Needs assistance Equipment used: Rolling walker (2 wheels) Transfers: Sit to/from Stand Sit to Stand: Supervision           General transfer comment: 25% VC's on proper hand placement and safety with turns.    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        Exercises   Total Knee Replacement TE's following HEP handout 10 reps B LE ankle pumps 05 reps towel squeezes 05 reps knee presses 05 reps heel slides  05 reps SAQ's 05 reps SLR's 05 reps ABD Educated on  use of gait belt to assist with TE's Followed by ICE    General Comments        Pertinent Vitals/Pain Pain Assessment Pain Assessment: 0-10 Pain Score: 3  Pain Location: L knee Pain Descriptors / Indicators: Tender, Sore, Operative site guarding Pain Intervention(s): Monitored during session, Repositioned, Premedicated before session, Ice applied    Home Living                          Prior Function            PT Goals (current goals can now be found in the care plan section) Acute Rehab PT Goals Patient Stated Goal: go home Progress towards PT goals: Progressing toward goals    Frequency    7X/week      PT Plan Current plan remains appropriate    Co-evaluation              AM-PAC PT "6  Clicks" Mobility   Outcome Measure  Help needed turning from your back to your side while in a flat bed without using bedrails?: None Help needed moving from lying on your back to sitting on the side of a flat bed without using bedrails?: None Help needed moving to and from a bed to a chair (including a wheelchair)?: None Help needed standing up from a chair using your arms (e.g., wheelchair or bedside chair)?: None Help needed to walk in hospital room?: A Little Help needed climbing 3-5 steps with a railing? : A Lot 6 Click Score: 21    End of Session Equipment Utilized During Treatment: Gait belt Activity Tolerance: Patient tolerated treatment well Patient left: in chair;with call bell/phone within reach;with chair alarm set Nurse Communication: Mobility status PT Visit Diagnosis: Unsteadiness on feet (R26.81)     Time: 4765-4650 PT Time Calculation (min) (ACUTE ONLY): 31 min  Charges:  $Gait Training: 8-22 mins $Therapeutic Exercise: 8-22 mins                     {Kaylene Dawn  PTA Acute  [employer] M-F          [phone] Weekend pager [phone]

## 2021-10-07 NOTE — Progress Notes (Signed)
Physical Therapy Treatment Patient Details Name: Craig Rice MRN: [id number] DOB: 19-Oct-1945 Today's Date: 10/07/2021   History of Present Illness 76 yo s//  LTKA on 09/2421. PMH: R THA and RTKA,  back surgery, SA, depression.    PT Comments    POD # 1 pm session Assisted with amb a functional distance in hallway and practiced stairs.  Addressed all mobility questions, discussed appropriate activity, educated on use of ICE.  Pt ready for D/C to home.   Recommendations for follow up therapy are one component of a multi-disciplinary discharge planning process, led by the attending physician.  Recommendations may be updated based on patient status, additional functional criteria and insurance authorization.  Follow Up Recommendations  Follow physician's recommendations for discharge plan and follow up therapies     Assistance Recommended at Discharge Set up Supervision/Assistance  Patient can return home with the following A little help with walking and/or transfers;Assistance with cooking/housework;Help with stairs or ramp for entrance;A little help with bathing/dressing/bathroom   Equipment Recommendations  None recommended by PT    Recommendations for Other Services       Precautions / Restrictions Precautions Precautions: Fall;Knee Precaution Comments: instructed no pillow under knee Restrictions Weight Bearing Restrictions: No Other Position/Activity Restrictions: WBAT     Mobility  Bed Mobility Overal bed mobility: Needs Assistance Bed Mobility: Supine to Sit     Supine to sit: Supervision, HOB elevated     General bed mobility comments: pt OOB in recliner    Transfers Overall transfer level: Needs assistance Equipment used: Rolling walker (2 wheels) Transfers: Sit to/from Stand Sit to Stand: Supervision           General transfer comment: <25% VC's on proper hand placement and safety with turns.    Ambulation/Gait Ambulation/Gait assistance:  Supervision Gait Distance (Feet): 115 Feet Assistive device: Rolling walker (2 wheels) Gait Pattern/deviations: Step-to pattern, Step-through pattern       General Gait Details: <25% VC's on proper walker to self distance and safety with turns   Stairs Stairs: Yes Stairs assistance: Supervision Stair Management: One rail Right, Step to pattern, Forwards Number of Stairs: 2 General stair comments: 25% VC's on proper sequencing and safety.  Performed twice.   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        Exercises      General Comments        Pertinent Vitals/Pain Pain Assessment Pain Assessment: 0-10 Pain Score: 3  Pain Location: L knee Pain Descriptors / Indicators: Tender, Sore, Operative site guarding Pain Intervention(s): Monitored during session, Repositioned, Premedicated before session, Ice applied    Home Living  Prior Function            PT Goals (current goals can now be found in the care plan section) Acute Rehab PT Goals Patient Stated Goal: go home Progress towards PT goals: Progressing toward goals    Frequency    7X/week      PT Plan Current plan remains appropriate    Co-evaluation              AM-PAC PT "6 Clicks" Mobility   Outcome Measure  Help needed turning from your back to your side while in a flat bed without using bedrails?: None Help needed moving from lying on your back to sitting on the side of a flat bed without using bedrails?: None Help needed moving to and  from a bed to a chair (including a wheelchair)?: None Help needed standing up from a chair using your arms (e.g., wheelchair or bedside chair)?: None Help needed to walk in hospital room?: A Little Help needed climbing 3-5 steps with a railing? : A Lot 6 Click Score: 21    End of Session Equipment Utilized During Treatment: Gait belt Activity Tolerance: Patient tolerated treatment well Patient left: in chair;with call bell/phone within reach;with chair alarm set Nurse Communication: Mobility status PT Visit Diagnosis: Unsteadiness on feet (R26.81)     Time: 4166-0630 PT Time Calculation (min) (ACUTE ONLY): 16 min  Charges:  $Gait Training: 8-22 mins                     Rica Koyanagi  PTA [facility] M-F          [phone] Weekend pager [phone]

## 2021-10-08 ENCOUNTER — Encounter (HOSPITAL_COMMUNITY): Payer: Self-pay | Admitting: Orthopedic Surgery

## 2021-10-09 DIAGNOSIS — M25562 Pain in left knee: Secondary | ICD-10-CM | POA: Diagnosis not present

## 2021-10-13 DIAGNOSIS — M25562 Pain in left knee: Secondary | ICD-10-CM | POA: Diagnosis not present

## 2021-10-13 NOTE — Discharge Summary (Signed)
Patient ID: Craig Rice MRN: [id number] DOB/AGE: Feb 24, 1946 76 y.o.  Admit date: 10/06/2021 Discharge date: 10/07/2021  Admission Diagnoses:  Principal Problem:   Osteoarthritis of left knee   Discharge Diagnoses:  Same  Past Medical History:  Diagnosis Date   Arthritis    osteoarthritis -knee   Depression    GERD (gastroesophageal reflux disease)    H/O hiatal hernia    Headache(784.0)    tx. migraines-"Metoprolol"   Pre-diabetes    manage with diet.   Sleep apnea    C-PAP    Surgeries: Procedure(s): TOTAL KNEE ARTHROPLASTY on 10/06/2021   Consultants:   Discharged Condition: Improved  Hospital Course: Craig Rice is an 76 y.o. male who was admitted 10/06/2021 for operative treatment ofOsteoarthritis of left knee. Patient has severe unremitting pain that affects sleep, daily activities, and work/hobbies. After pre-op clearance the patient was taken to the operating room on 10/06/2021 and underwent  Procedure(s): TOTAL KNEE ARTHROPLASTY.    Patient was given perioperative antibiotics:  Anti-infectives (From admission, onward)    Start     Dose/Rate Route Frequency Ordered Stop   10/06/21 1600  ceFAZolin (ANCEF) IVPB 2g/100 mL premix        2 g 200 mL/hr over 30 Minutes Intravenous Every 6 hours 10/06/21 1318 10/06/21 2226   10/06/21 0730  ceFAZolin (ANCEF) IVPB 3g/100 mL premix        3 g 200 mL/hr over 30 Minutes Intravenous On call to O.R. 10/06/21 0717 10/06/21 0939   10/06/21 0730  vancomycin (VANCOREADY) IVPB 1500 mg/300 mL        1,500 mg 150 mL/hr over 120 Minutes Intravenous On call 10/06/21 0725 10/06/21 1023   10/06/21 0724  vancomycin (VANCOCIN) IVPB 1000 mg/200 mL premix  Status:  Discontinued        1,000 mg 200 mL/hr over 60 Minutes Intravenous 120 min pre-op 10/06/21 0724 10/06/21 0727        Patient was given sequential compression devices, early ambulation, and chemoprophylaxis to prevent DVT.  Patient benefited maximally from hospital stay  and there were no complications.    Recent vital signs: No data found.   Recent laboratory studies: No results for input(s): "WBC", "HGB", "HCT", "PLT", "NA", "K", "CL", "CO2", "BUN", "CREATININE", "GLUCOSE", "INR", "CALCIUM" in the last 72 hours.  Invalid input(s): "PT", "2"   Discharge Medications:   Allergies as of 10/07/2021       Reactions   Morphine And Related Itching        Medication List     TAKE these medications    acetaminophen 650 MG CR tablet Commonly known as: TYLENOL Take 1,300 mg by mouth in the morning and at bedtime.   allopurinol 100 MG tablet Commonly known as: ZYLOPRIM Take 200 mg by mouth daily.   aspirin EC 325 MG tablet Take 1 tablet (325 mg total) by mouth 2 (two) times daily for 20 days. Then take one 81 mg aspirin once a day for three weeks. Then discontinue aspirin.   cetirizine 10 MG tablet Commonly known as: ZYRTEC Take 2.5 mg by mouth daily as needed for allergies.   ezetimibe 10 MG tablet Commonly known as: ZETIA Take 10 mg by mouth daily.   methocarbamol 500 MG tablet Commonly known as: ROBAXIN Take 1 tablet (500 mg total) by mouth every 6 (six) hours as needed for muscle spasms.   metoprolol tartrate 50 MG tablet Commonly known as: LOPRESSOR Take 50 mg by mouth daily before breakfast.  omeprazole 20 MG tablet Commonly known as: PRILOSEC OTC Take 10-20 mg by mouth daily as needed (acid reflux).   oxyCODONE 5 MG immediate release tablet Commonly known as: Oxy IR/ROXICODONE Take 1-2 tablets (5-10 mg total) by mouth every 6 (six) hours as needed for moderate pain or severe pain.   PARoxetine 20 MG tablet Commonly known as: PAXIL Take 20 mg by mouth daily before breakfast.               Discharge Care Instructions  (From admission, onward)           Start     Ordered   10/07/21 0000  Weight bearing as tolerated        10/07/21 0822   10/07/21 0000  Change dressing       Comments: You may remove the bulky  bandage (ACE wrap and gauze) two days after surgery. You will have an adhesive waterproof bandage underneath. Leave this in place until your first follow-up appointment.   10/07/21 9326            Diagnostic Studies: No results found.  Disposition: Discharge disposition: 01-Home or Self Care       Discharge Instructions     Call MD / Call 911   Complete by: As directed    If you experience chest pain or shortness of breath, CALL 911 and be transported to the hospital emergency room.  If you develope a fever above 101 F, pus (white drainage) or increased drainage or redness at the wound, or calf pain, call your surgeon's office.   Change dressing   Complete by: As directed    You may remove the bulky bandage (ACE wrap and gauze) two days after surgery. You will have an adhesive waterproof bandage underneath. Leave this in place until your first follow-up appointment.   Constipation Prevention   Complete by: As directed    Drink plenty of fluids.  Prune juice may be helpful.  You may use a stool softener, such as Colace (over the counter) 100 mg twice a day.  Use MiraLax (over the counter) for constipation as needed.   Diet - low sodium heart healthy   Complete by: As directed    Do not put a pillow under the knee. Place it under the heel.   Complete by: As directed    Driving restrictions   Complete by: As directed    No driving for two weeks   Post-operative opioid taper instructions:   Complete by: As directed    POST-OPERATIVE OPIOID TAPER INSTRUCTIONS: It is important to wean off of your opioid medication as soon as possible. If you do not need pain medication after your surgery it is ok to stop day one. Opioids include: Codeine, Hydrocodone(Norco, Vicodin), Oxycodone(Percocet, oxycontin) and hydromorphone amongst others.  Long term and even short term use of opiods can cause: Increased pain response Dependence Constipation Depression Respiratory depression And  more.  Withdrawal symptoms can include Flu like symptoms Nausea, vomiting And more Techniques to manage these symptoms Hydrate well Eat regular healthy meals Stay active Use relaxation techniques(deep breathing, meditating, yoga) Do Not substitute Alcohol to help with tapering If you have been on opioids for less than two weeks and do not have pain than it is ok to stop all together.  Plan to wean off of opioids This plan should start within one week post op of your joint replacement. Maintain the same interval or time between taking each dose and first  decrease the dose.  Cut the total daily intake of opioids by one tablet each day Next start to increase the time between doses. The last dose that should be eliminated is the evening dose.      TED hose   Complete by: As directed    Use stockings (TED hose) for three weeks on both leg(s).  You may remove them at night for sleeping.   Weight bearing as tolerated   Complete by: As directed         Follow-up Information     Gaynelle Arabian, MD. Go on 10/22/2021.   Specialty: Orthopedic Surgery Why: You are scheduled for a follow up appointment on 10-22-21 at 4:00 pm. Contact information: [address] [place] [address] [phone]                  Signed: Theresa Duty 10/13/2021, 7:20 AM

## 2021-10-15 DIAGNOSIS — M25562 Pain in left knee: Secondary | ICD-10-CM | POA: Diagnosis not present

## 2021-10-17 DIAGNOSIS — M25562 Pain in left knee: Secondary | ICD-10-CM | POA: Diagnosis not present

## 2021-10-20 DIAGNOSIS — M25562 Pain in left knee: Secondary | ICD-10-CM | POA: Diagnosis not present

## 2021-10-21 DIAGNOSIS — R269 Unspecified abnormalities of gait and mobility: Secondary | ICD-10-CM | POA: Diagnosis not present

## 2021-10-21 DIAGNOSIS — G4733 Obstructive sleep apnea (adult) (pediatric): Secondary | ICD-10-CM | POA: Diagnosis not present

## 2021-10-21 DIAGNOSIS — R400 Somnolence: Secondary | ICD-10-CM | POA: Diagnosis not present

## 2021-10-22 DIAGNOSIS — M25562 Pain in left knee: Secondary | ICD-10-CM | POA: Diagnosis not present

## 2021-10-24 DIAGNOSIS — M25562 Pain in left knee: Secondary | ICD-10-CM | POA: Diagnosis not present

## 2021-10-28 DIAGNOSIS — M25562 Pain in left knee: Secondary | ICD-10-CM | POA: Diagnosis not present

## 2021-10-30 DIAGNOSIS — M25562 Pain in left knee: Secondary | ICD-10-CM | POA: Diagnosis not present

## 2021-11-03 DIAGNOSIS — M25562 Pain in left knee: Secondary | ICD-10-CM | POA: Diagnosis not present

## 2021-11-06 DIAGNOSIS — M25562 Pain in left knee: Secondary | ICD-10-CM | POA: Diagnosis not present

## 2021-11-10 DIAGNOSIS — Z5189 Encounter for other specified aftercare: Secondary | ICD-10-CM | POA: Diagnosis not present

## 2021-11-10 DIAGNOSIS — M25562 Pain in left knee: Secondary | ICD-10-CM | POA: Diagnosis not present

## 2021-11-12 DIAGNOSIS — M25562 Pain in left knee: Secondary | ICD-10-CM | POA: Diagnosis not present

## 2021-11-21 DIAGNOSIS — G4733 Obstructive sleep apnea (adult) (pediatric): Secondary | ICD-10-CM | POA: Diagnosis not present

## 2021-11-21 DIAGNOSIS — R269 Unspecified abnormalities of gait and mobility: Secondary | ICD-10-CM | POA: Diagnosis not present

## 2021-11-21 DIAGNOSIS — R400 Somnolence: Secondary | ICD-10-CM | POA: Diagnosis not present

## 2021-11-25 DIAGNOSIS — L509 Urticaria, unspecified: Secondary | ICD-10-CM | POA: Diagnosis not present

## 2021-12-17 DIAGNOSIS — N451 Epididymitis: Secondary | ICD-10-CM | POA: Diagnosis not present

## 2021-12-17 DIAGNOSIS — R300 Dysuria: Secondary | ICD-10-CM | POA: Diagnosis not present

## 2021-12-21 DIAGNOSIS — G4733 Obstructive sleep apnea (adult) (pediatric): Secondary | ICD-10-CM | POA: Diagnosis not present

## 2021-12-21 DIAGNOSIS — R269 Unspecified abnormalities of gait and mobility: Secondary | ICD-10-CM | POA: Diagnosis not present

## 2021-12-21 DIAGNOSIS — R400 Somnolence: Secondary | ICD-10-CM | POA: Diagnosis not present

## 2022-01-13 DIAGNOSIS — Z1211 Encounter for screening for malignant neoplasm of colon: Secondary | ICD-10-CM | POA: Diagnosis not present

## 2022-01-13 DIAGNOSIS — R195 Other fecal abnormalities: Secondary | ICD-10-CM | POA: Diagnosis not present

## 2022-01-21 DIAGNOSIS — G4733 Obstructive sleep apnea (adult) (pediatric): Secondary | ICD-10-CM | POA: Diagnosis not present

## 2022-01-21 DIAGNOSIS — R400 Somnolence: Secondary | ICD-10-CM | POA: Diagnosis not present

## 2022-01-21 DIAGNOSIS — R269 Unspecified abnormalities of gait and mobility: Secondary | ICD-10-CM | POA: Diagnosis not present

## 2022-01-27 DIAGNOSIS — H524 Presbyopia: Secondary | ICD-10-CM | POA: Diagnosis not present

## 2022-01-27 DIAGNOSIS — H2513 Age-related nuclear cataract, bilateral: Secondary | ICD-10-CM | POA: Diagnosis not present

## 2022-02-20 DIAGNOSIS — G4733 Obstructive sleep apnea (adult) (pediatric): Secondary | ICD-10-CM | POA: Diagnosis not present

## 2022-02-20 DIAGNOSIS — R400 Somnolence: Secondary | ICD-10-CM | POA: Diagnosis not present

## 2022-02-20 DIAGNOSIS — R269 Unspecified abnormalities of gait and mobility: Secondary | ICD-10-CM | POA: Diagnosis not present

## 2022-02-24 DIAGNOSIS — K5730 Diverticulosis of large intestine without perforation or abscess without bleeding: Secondary | ICD-10-CM | POA: Diagnosis not present

## 2022-02-24 DIAGNOSIS — D124 Benign neoplasm of descending colon: Secondary | ICD-10-CM | POA: Diagnosis not present

## 2022-02-24 DIAGNOSIS — K635 Polyp of colon: Secondary | ICD-10-CM | POA: Diagnosis not present

## 2022-02-24 DIAGNOSIS — Z1211 Encounter for screening for malignant neoplasm of colon: Secondary | ICD-10-CM | POA: Diagnosis not present

## 2022-02-24 DIAGNOSIS — K648 Other hemorrhoids: Secondary | ICD-10-CM | POA: Diagnosis not present

## 2022-02-24 DIAGNOSIS — K621 Rectal polyp: Secondary | ICD-10-CM | POA: Diagnosis not present

## 2022-02-26 DIAGNOSIS — K635 Polyp of colon: Secondary | ICD-10-CM | POA: Diagnosis not present

## 2022-02-26 DIAGNOSIS — D124 Benign neoplasm of descending colon: Secondary | ICD-10-CM | POA: Diagnosis not present

## 2022-02-26 DIAGNOSIS — K621 Rectal polyp: Secondary | ICD-10-CM | POA: Diagnosis not present

## 2022-03-04 DIAGNOSIS — Z03818 Encounter for observation for suspected exposure to other biological agents ruled out: Secondary | ICD-10-CM | POA: Diagnosis not present

## 2022-03-04 DIAGNOSIS — R509 Fever, unspecified: Secondary | ICD-10-CM | POA: Diagnosis not present

## 2022-03-04 DIAGNOSIS — R059 Cough, unspecified: Secondary | ICD-10-CM | POA: Diagnosis not present

## 2022-03-04 DIAGNOSIS — R0981 Nasal congestion: Secondary | ICD-10-CM | POA: Diagnosis not present

## 2022-03-04 DIAGNOSIS — J101 Influenza due to other identified influenza virus with other respiratory manifestations: Secondary | ICD-10-CM | POA: Diagnosis not present

## 2022-03-23 DIAGNOSIS — R400 Somnolence: Secondary | ICD-10-CM | POA: Diagnosis not present

## 2022-03-23 DIAGNOSIS — R269 Unspecified abnormalities of gait and mobility: Secondary | ICD-10-CM | POA: Diagnosis not present

## 2022-03-23 DIAGNOSIS — G4733 Obstructive sleep apnea (adult) (pediatric): Secondary | ICD-10-CM | POA: Diagnosis not present

## 2022-04-18 DIAGNOSIS — R0981 Nasal congestion: Secondary | ICD-10-CM | POA: Diagnosis not present

## 2022-04-18 DIAGNOSIS — U071 COVID-19: Secondary | ICD-10-CM | POA: Diagnosis not present

## 2022-04-18 DIAGNOSIS — R509 Fever, unspecified: Secondary | ICD-10-CM | POA: Diagnosis not present

## 2022-04-18 DIAGNOSIS — B349 Viral infection, unspecified: Secondary | ICD-10-CM | POA: Diagnosis not present

## 2022-04-18 DIAGNOSIS — J029 Acute pharyngitis, unspecified: Secondary | ICD-10-CM | POA: Diagnosis not present

## 2022-04-18 DIAGNOSIS — Z03818 Encounter for observation for suspected exposure to other biological agents ruled out: Secondary | ICD-10-CM | POA: Diagnosis not present

## 2022-04-23 DIAGNOSIS — G4733 Obstructive sleep apnea (adult) (pediatric): Secondary | ICD-10-CM | POA: Diagnosis not present

## 2022-04-23 DIAGNOSIS — R400 Somnolence: Secondary | ICD-10-CM | POA: Diagnosis not present

## 2022-04-23 DIAGNOSIS — R269 Unspecified abnormalities of gait and mobility: Secondary | ICD-10-CM | POA: Diagnosis not present

## 2022-06-03 DIAGNOSIS — G4733 Obstructive sleep apnea (adult) (pediatric): Secondary | ICD-10-CM | POA: Diagnosis not present

## 2022-06-09 DIAGNOSIS — L820 Inflamed seborrheic keratosis: Secondary | ICD-10-CM | POA: Diagnosis not present

## 2022-06-09 DIAGNOSIS — D229 Melanocytic nevi, unspecified: Secondary | ICD-10-CM | POA: Diagnosis not present

## 2022-06-09 DIAGNOSIS — L814 Other melanin hyperpigmentation: Secondary | ICD-10-CM | POA: Diagnosis not present

## 2022-06-09 DIAGNOSIS — L853 Xerosis cutis: Secondary | ICD-10-CM | POA: Diagnosis not present

## 2022-06-09 DIAGNOSIS — D1801 Hemangioma of skin and subcutaneous tissue: Secondary | ICD-10-CM | POA: Diagnosis not present

## 2022-06-09 DIAGNOSIS — L578 Other skin changes due to chronic exposure to nonionizing radiation: Secondary | ICD-10-CM | POA: Diagnosis not present

## 2022-06-09 DIAGNOSIS — L918 Other hypertrophic disorders of the skin: Secondary | ICD-10-CM | POA: Diagnosis not present

## 2022-06-09 DIAGNOSIS — L821 Other seborrheic keratosis: Secondary | ICD-10-CM | POA: Diagnosis not present

## 2022-06-09 DIAGNOSIS — L603 Nail dystrophy: Secondary | ICD-10-CM | POA: Diagnosis not present

## 2022-07-04 DIAGNOSIS — G4733 Obstructive sleep apnea (adult) (pediatric): Secondary | ICD-10-CM | POA: Diagnosis not present

## 2022-07-13 DIAGNOSIS — Z0000 Encounter for general adult medical examination without abnormal findings: Secondary | ICD-10-CM | POA: Diagnosis not present

## 2022-07-13 DIAGNOSIS — I10 Essential (primary) hypertension: Secondary | ICD-10-CM | POA: Diagnosis not present

## 2022-07-13 DIAGNOSIS — R7301 Impaired fasting glucose: Secondary | ICD-10-CM | POA: Diagnosis not present

## 2022-07-13 DIAGNOSIS — M109 Gout, unspecified: Secondary | ICD-10-CM | POA: Diagnosis not present

## 2022-07-13 DIAGNOSIS — E785 Hyperlipidemia, unspecified: Secondary | ICD-10-CM | POA: Diagnosis not present

## 2022-07-13 DIAGNOSIS — R3911 Hesitancy of micturition: Secondary | ICD-10-CM | POA: Diagnosis not present

## 2022-07-15 DIAGNOSIS — R7301 Impaired fasting glucose: Secondary | ICD-10-CM | POA: Diagnosis not present

## 2022-07-15 DIAGNOSIS — I10 Essential (primary) hypertension: Secondary | ICD-10-CM | POA: Diagnosis not present

## 2022-07-15 DIAGNOSIS — F338 Other recurrent depressive disorders: Secondary | ICD-10-CM | POA: Diagnosis not present

## 2022-07-15 DIAGNOSIS — Z0000 Encounter for general adult medical examination without abnormal findings: Secondary | ICD-10-CM | POA: Diagnosis not present

## 2022-07-15 DIAGNOSIS — E785 Hyperlipidemia, unspecified: Secondary | ICD-10-CM | POA: Diagnosis not present

## 2022-08-03 DIAGNOSIS — G4733 Obstructive sleep apnea (adult) (pediatric): Secondary | ICD-10-CM | POA: Diagnosis not present

## 2022-12-29 DIAGNOSIS — G4733 Obstructive sleep apnea (adult) (pediatric): Secondary | ICD-10-CM | POA: Diagnosis not present

## 2023-02-09 DIAGNOSIS — H40013 Open angle with borderline findings, low risk, bilateral: Secondary | ICD-10-CM | POA: Diagnosis not present

## 2023-02-09 DIAGNOSIS — H524 Presbyopia: Secondary | ICD-10-CM | POA: Diagnosis not present

## 2023-02-09 DIAGNOSIS — H2513 Age-related nuclear cataract, bilateral: Secondary | ICD-10-CM | POA: Diagnosis not present

## 2023-03-08 DIAGNOSIS — G4733 Obstructive sleep apnea (adult) (pediatric): Secondary | ICD-10-CM | POA: Diagnosis not present

## 2023-06-23 DIAGNOSIS — R1084 Generalized abdominal pain: Secondary | ICD-10-CM | POA: Diagnosis not present

## 2023-06-23 DIAGNOSIS — R300 Dysuria: Secondary | ICD-10-CM | POA: Diagnosis not present

## 2023-08-02 DIAGNOSIS — G4733 Obstructive sleep apnea (adult) (pediatric): Secondary | ICD-10-CM | POA: Diagnosis not present

## 2023-08-02 DIAGNOSIS — M109 Gout, unspecified: Secondary | ICD-10-CM | POA: Diagnosis not present

## 2023-08-02 DIAGNOSIS — F338 Other recurrent depressive disorders: Secondary | ICD-10-CM | POA: Diagnosis not present

## 2023-08-02 DIAGNOSIS — R7301 Impaired fasting glucose: Secondary | ICD-10-CM | POA: Diagnosis not present

## 2023-08-02 DIAGNOSIS — E785 Hyperlipidemia, unspecified: Secondary | ICD-10-CM | POA: Diagnosis not present

## 2023-08-02 DIAGNOSIS — I10 Essential (primary) hypertension: Secondary | ICD-10-CM | POA: Diagnosis not present

## 2023-08-02 DIAGNOSIS — Z0000 Encounter for general adult medical examination without abnormal findings: Secondary | ICD-10-CM | POA: Diagnosis not present

## 2023-12-29 DIAGNOSIS — L570 Actinic keratosis: Secondary | ICD-10-CM | POA: Diagnosis not present

## 2023-12-29 DIAGNOSIS — L814 Other melanin hyperpigmentation: Secondary | ICD-10-CM | POA: Diagnosis not present

## 2023-12-29 DIAGNOSIS — L603 Nail dystrophy: Secondary | ICD-10-CM | POA: Diagnosis not present

## 2023-12-29 DIAGNOSIS — L918 Other hypertrophic disorders of the skin: Secondary | ICD-10-CM | POA: Diagnosis not present

## 2023-12-29 DIAGNOSIS — L820 Inflamed seborrheic keratosis: Secondary | ICD-10-CM | POA: Diagnosis not present

## 2023-12-29 DIAGNOSIS — L853 Xerosis cutis: Secondary | ICD-10-CM | POA: Diagnosis not present

## 2023-12-29 DIAGNOSIS — G4733 Obstructive sleep apnea (adult) (pediatric): Secondary | ICD-10-CM | POA: Diagnosis not present

## 2023-12-29 DIAGNOSIS — D1801 Hemangioma of skin and subcutaneous tissue: Secondary | ICD-10-CM | POA: Diagnosis not present

## 2023-12-29 DIAGNOSIS — L821 Other seborrheic keratosis: Secondary | ICD-10-CM | POA: Diagnosis not present

## 2023-12-29 DIAGNOSIS — L578 Other skin changes due to chronic exposure to nonionizing radiation: Secondary | ICD-10-CM | POA: Diagnosis not present

## 2023-12-29 DIAGNOSIS — D229 Melanocytic nevi, unspecified: Secondary | ICD-10-CM | POA: Diagnosis not present

## 2024-01-04 DIAGNOSIS — G4733 Obstructive sleep apnea (adult) (pediatric): Secondary | ICD-10-CM | POA: Diagnosis not present

## 2024-02-14 DIAGNOSIS — H524 Presbyopia: Secondary | ICD-10-CM | POA: Diagnosis not present

## 2024-02-14 DIAGNOSIS — H2513 Age-related nuclear cataract, bilateral: Secondary | ICD-10-CM | POA: Diagnosis not present

## 2024-02-14 DIAGNOSIS — H40013 Open angle with borderline findings, low risk, bilateral: Secondary | ICD-10-CM | POA: Diagnosis not present
